# Patient Record
Sex: Female | Born: 1950 | Race: Black or African American | Hispanic: No | Marital: Married | Smoking: Current every day smoker
Health system: Southern US, Community
[De-identification: ages and names within clinical notes are randomized; demographics above are authoritative.]

## PROBLEM LIST (undated history)

## (undated) DIAGNOSIS — E785 Hyperlipidemia, unspecified: Secondary | ICD-10-CM

## (undated) DIAGNOSIS — C50919 Malignant neoplasm of unspecified site of unspecified female breast: Secondary | ICD-10-CM

## (undated) DIAGNOSIS — E041 Nontoxic single thyroid nodule: Secondary | ICD-10-CM

## (undated) DIAGNOSIS — Z800 Family history of malignant neoplasm of digestive organs: Secondary | ICD-10-CM

## (undated) DIAGNOSIS — F17200 Nicotine dependence, unspecified, uncomplicated: Secondary | ICD-10-CM

## (undated) DIAGNOSIS — K219 Gastro-esophageal reflux disease without esophagitis: Secondary | ICD-10-CM

## (undated) DIAGNOSIS — F419 Anxiety disorder, unspecified: Secondary | ICD-10-CM

## (undated) DIAGNOSIS — R232 Flushing: Secondary | ICD-10-CM

## (undated) DIAGNOSIS — F329 Major depressive disorder, single episode, unspecified: Secondary | ICD-10-CM

## (undated) DIAGNOSIS — C801 Malignant (primary) neoplasm, unspecified: Secondary | ICD-10-CM

## (undated) DIAGNOSIS — I10 Essential (primary) hypertension: Secondary | ICD-10-CM

## (undated) DIAGNOSIS — F32A Depression, unspecified: Secondary | ICD-10-CM

## (undated) DIAGNOSIS — Z1379 Encounter for other screening for genetic and chromosomal anomalies: Secondary | ICD-10-CM

## (undated) DIAGNOSIS — Z923 Personal history of irradiation: Secondary | ICD-10-CM

## (undated) HISTORY — DX: Nicotine dependence, unspecified, uncomplicated: F17.200

## (undated) HISTORY — DX: Anxiety disorder, unspecified: F41.9

## (undated) HISTORY — DX: Malignant (primary) neoplasm, unspecified: C80.1

## (undated) HISTORY — DX: Depression, unspecified: F32.A

## (undated) HISTORY — DX: Nontoxic single thyroid nodule: E04.1

## (undated) HISTORY — DX: Major depressive disorder, single episode, unspecified: F32.9

## (undated) HISTORY — DX: Hyperlipidemia, unspecified: E78.5

## (undated) HISTORY — DX: Personal history of irradiation: Z92.3

## (undated) HISTORY — DX: Essential (primary) hypertension: I10

## (undated) HISTORY — PX: TUBAL LIGATION: SHX77

## (undated) HISTORY — DX: Encounter for other screening for genetic and chromosomal anomalies: Z13.79

## (undated) HISTORY — PX: OTHER SURGICAL HISTORY: SHX169

## (undated) HISTORY — PX: BIOPSY THYROID: PRO38

## (undated) HISTORY — DX: Family history of malignant neoplasm of digestive organs: Z80.0

## (undated) HISTORY — DX: Flushing: R23.2

## (undated) HISTORY — DX: Gastro-esophageal reflux disease without esophagitis: K21.9

---

## 1978-11-16 HISTORY — PX: GYNECOLOGIC CRYOSURGERY: SHX857

## 1992-11-16 HISTORY — PX: BREAST SURGERY: SHX581

## 1998-05-18 ENCOUNTER — Emergency Department (HOSPITAL_COMMUNITY): Admission: EM | Admit: 1998-05-18 | Discharge: 1998-05-18 | Payer: Self-pay | Admitting: Emergency Medicine

## 1998-05-21 ENCOUNTER — Emergency Department (HOSPITAL_COMMUNITY): Admission: EM | Admit: 1998-05-21 | Discharge: 1998-05-21 | Payer: Self-pay

## 2000-03-02 ENCOUNTER — Emergency Department (HOSPITAL_COMMUNITY): Admission: EM | Admit: 2000-03-02 | Discharge: 2000-03-02 | Payer: Self-pay

## 2001-02-15 ENCOUNTER — Other Ambulatory Visit: Admission: RE | Admit: 2001-02-15 | Discharge: 2001-02-15 | Payer: Self-pay | Admitting: *Deleted

## 2001-02-15 ENCOUNTER — Encounter (INDEPENDENT_AMBULATORY_CARE_PROVIDER_SITE_OTHER): Payer: Self-pay | Admitting: Specialist

## 2003-03-12 ENCOUNTER — Other Ambulatory Visit: Admission: RE | Admit: 2003-03-12 | Discharge: 2003-03-12 | Payer: Self-pay | Admitting: Gynecology

## 2005-10-28 ENCOUNTER — Other Ambulatory Visit: Admission: RE | Admit: 2005-10-28 | Discharge: 2005-10-28 | Payer: Self-pay | Admitting: Gynecology

## 2005-12-04 ENCOUNTER — Encounter: Admission: RE | Admit: 2005-12-04 | Discharge: 2005-12-04 | Payer: Self-pay | Admitting: Family Medicine

## 2005-12-14 ENCOUNTER — Other Ambulatory Visit: Admission: RE | Admit: 2005-12-14 | Discharge: 2005-12-14 | Payer: Self-pay | Admitting: Interventional Radiology

## 2005-12-14 ENCOUNTER — Encounter: Admission: RE | Admit: 2005-12-14 | Discharge: 2005-12-14 | Payer: Self-pay | Admitting: Family Medicine

## 2005-12-14 ENCOUNTER — Encounter (INDEPENDENT_AMBULATORY_CARE_PROVIDER_SITE_OTHER): Payer: Self-pay | Admitting: *Deleted

## 2006-01-20 ENCOUNTER — Encounter (INDEPENDENT_AMBULATORY_CARE_PROVIDER_SITE_OTHER): Payer: Self-pay | Admitting: Specialist

## 2006-01-20 ENCOUNTER — Encounter: Admission: RE | Admit: 2006-01-20 | Discharge: 2006-01-20 | Payer: Self-pay | Admitting: Surgery

## 2006-04-28 ENCOUNTER — Encounter: Admission: RE | Admit: 2006-04-28 | Discharge: 2006-04-28 | Payer: Self-pay | Admitting: Surgery

## 2006-07-23 ENCOUNTER — Encounter: Admission: RE | Admit: 2006-07-23 | Discharge: 2006-07-23 | Payer: Self-pay | Admitting: Surgery

## 2006-12-23 ENCOUNTER — Other Ambulatory Visit: Admission: RE | Admit: 2006-12-23 | Discharge: 2006-12-23 | Payer: Self-pay | Admitting: Gynecology

## 2008-05-21 ENCOUNTER — Ambulatory Visit: Payer: Self-pay | Admitting: Gastroenterology

## 2008-05-28 ENCOUNTER — Encounter: Payer: Self-pay | Admitting: Gastroenterology

## 2008-05-28 ENCOUNTER — Ambulatory Visit: Payer: Self-pay | Admitting: Gastroenterology

## 2008-05-30 ENCOUNTER — Other Ambulatory Visit: Admission: RE | Admit: 2008-05-30 | Discharge: 2008-05-30 | Payer: Self-pay | Admitting: Gynecology

## 2008-05-30 ENCOUNTER — Encounter: Payer: Self-pay | Admitting: Gastroenterology

## 2010-01-15 ENCOUNTER — Encounter: Admission: RE | Admit: 2010-01-15 | Discharge: 2010-01-15 | Payer: Self-pay | Admitting: Surgery

## 2010-12-05 ENCOUNTER — Other Ambulatory Visit
Admission: RE | Admit: 2010-12-05 | Discharge: 2010-12-05 | Payer: Self-pay | Source: Home / Self Care | Admitting: Obstetrics and Gynecology

## 2010-12-05 ENCOUNTER — Other Ambulatory Visit: Payer: Self-pay | Admitting: Women's Health

## 2010-12-05 ENCOUNTER — Ambulatory Visit
Admission: RE | Admit: 2010-12-05 | Discharge: 2010-12-05 | Payer: Self-pay | Source: Home / Self Care | Attending: Women's Health | Admitting: Women's Health

## 2011-12-21 ENCOUNTER — Encounter (INDEPENDENT_AMBULATORY_CARE_PROVIDER_SITE_OTHER): Payer: Self-pay

## 2012-03-20 ENCOUNTER — Other Ambulatory Visit: Payer: Self-pay | Admitting: *Deleted

## 2012-03-20 ENCOUNTER — Ambulatory Visit (INDEPENDENT_AMBULATORY_CARE_PROVIDER_SITE_OTHER): Payer: Federal, State, Local not specified - PPO | Admitting: Family Medicine

## 2012-03-20 ENCOUNTER — Encounter: Payer: Self-pay | Admitting: Family Medicine

## 2012-03-20 DIAGNOSIS — T783XXA Angioneurotic edema, initial encounter: Secondary | ICD-10-CM

## 2012-03-20 DIAGNOSIS — L509 Urticaria, unspecified: Secondary | ICD-10-CM

## 2012-03-20 MED ORDER — EPINEPHRINE 0.3 MG/0.3ML IJ DEVI: 0.3000 mg | Freq: Once | INTRAMUSCULAR | Status: DC

## 2012-03-20 MED ORDER — HYDROXYZINE HCL 10 MG PO TABS: 10.0000 mg | ORAL_TABLET | Freq: Three times a day (TID) | ORAL | Status: DC | PRN

## 2012-03-20 MED ORDER — NON FORMULARY
10.0000 mg | Freq: Once | Status: AC
  Administered 2012-03-20: 10 mg via ORAL

## 2012-03-20 MED ORDER — NON FORMULARY
300.0000 mg | Freq: Once | Status: AC
  Administered 2012-03-20: 300 mg via ORAL

## 2012-03-20 MED ORDER — HYDROXYZINE HCL 10 MG PO TABS: 10.0000 mg | ORAL_TABLET | Freq: Three times a day (TID) | ORAL | Status: AC | PRN

## 2012-03-20 MED ORDER — PREDNISONE 20 MG PO TABS: ORAL_TABLET | ORAL | Status: DC

## 2012-03-20 MED ORDER — RANITIDINE HCL 150 MG PO TABS: 150.0000 mg | ORAL_TABLET | Freq: Two times a day (BID) | ORAL | Status: DC

## 2012-03-20 MED ORDER — METHYLPREDNISOLONE SODIUM SUCC 125 MG IJ SOLR
125.0000 mg | Freq: Once | INTRAMUSCULAR | Status: AC
  Administered 2012-03-20: 125 mg via INTRAVENOUS

## 2012-03-20 NOTE — Progress Notes (Signed)
  Subjective:    Patient ID: Jessica [name], female    DOB: [birth year], 61 y.o.   MRN: [id number]  HPI  Patient presents after developing urticaria yesterday afternoon after eating store bought potato salad.    Took benadryl 6 PM and again at 12:30 PM with improvement in urticaria.  Woke up today with significant swelling to lower lip and edema of her eye lids. Continues to be very uncomfortable with migratory urticaria.  No new soaps, lotions or other body products No new medications No recent travel Review of Systems     Objective:   Physical Exam  Constitutional: She appears well-developed.  HENT:  Mouth/Throat: No uvula swelling. No posterior oropharyngeal edema.       Edema upper lid OS>OD Swelling to lower lip  Neck: Neck supple. No thyromegaly present.  Cardiovascular: Normal rate, regular rhythm and normal heart sounds.   Pulmonary/Chest: Effort normal and breath sounds normal.  Neurological: She is alert.  Skin:       Urticaria face, neck, chest and extremities          Assessment & Plan:   1. Urticaria  methylPREDNISolone sodium succinate (SOLU-MEDROL) 125 mg/2 mL injection 125 mg, NON FORMULARY 300 mg, NON FORMULARY 10 mg,   2. Angioedema of lips  methylPREDNISolone sodium succinate (SOLU-MEDROL) 125 mg/2 mL injection 125 mg, NON FORMULARY 300 mg, NON FORMULARY 10 mg,     See medications on AVS ER overnight if angioedema returns

## 2012-03-21 ENCOUNTER — Telehealth: Payer: Self-pay

## 2012-03-21 NOTE — Telephone Encounter (Signed)
Pt called in and reported that her rash is completely gone and everything was cleared up after IV yesterday for allergic Rxn, but she got up in middle of night hungry and had some cereal and OJ and when she woke up she had some swelling in her upper lip again. No rash, no swelling of tongue/throat. Advised pt she should RTC to be evaluated. She stated she just took her prednisone, but she only took 10 mg bc she has taken 20 mg in past and she could not tolerate it - she can not take as Dr Hal Hope Rxd. Also, she hadn't taken hydroxyzine or Zantac yet. Pt stated she didn't get the Zantac bc it looked like it was another antihistimine. Advised pt that it is good to take it together w/hydrox for this type of Rxn. Pt stated she would take a dose of hydroxyzine and RTC if lip doesn't improve in a couple of hours. Pt did agree to RTC or ED if anything worsened and stated she also has her Epi Pen. Pt stated she will CB to let us know her status.

## 2012-03-21 NOTE — Telephone Encounter (Signed)
Noted  

## 2012-07-06 ENCOUNTER — Ambulatory Visit (INDEPENDENT_AMBULATORY_CARE_PROVIDER_SITE_OTHER): Payer: Self-pay | Admitting: Surgery

## 2012-09-01 ENCOUNTER — Emergency Department (HOSPITAL_COMMUNITY)
Admission: EM | Admit: 2012-09-01 | Discharge: 2012-09-01 | Disposition: A | Discharge status: Home / Self Care | Payer: Federal, State, Local not specified - PPO | Attending: Emergency Medicine | Admitting: Emergency Medicine

## 2012-09-01 ENCOUNTER — Encounter (HOSPITAL_COMMUNITY): Payer: Self-pay | Admitting: *Deleted

## 2012-09-01 DIAGNOSIS — I10 Essential (primary) hypertension: Secondary | ICD-10-CM | POA: Insufficient documentation

## 2012-09-01 DIAGNOSIS — Z853 Personal history of malignant neoplasm of breast: Secondary | ICD-10-CM | POA: Insufficient documentation

## 2012-09-01 DIAGNOSIS — F17200 Nicotine dependence, unspecified, uncomplicated: Secondary | ICD-10-CM | POA: Insufficient documentation

## 2012-09-01 DIAGNOSIS — E559 Vitamin D deficiency, unspecified: Secondary | ICD-10-CM | POA: Insufficient documentation

## 2012-09-01 DIAGNOSIS — M810 Age-related osteoporosis without current pathological fracture: Secondary | ICD-10-CM | POA: Insufficient documentation

## 2012-09-01 DIAGNOSIS — M542 Cervicalgia: Secondary | ICD-10-CM | POA: Insufficient documentation

## 2012-09-01 LAB — POCT I-STAT TROPONIN I: Troponin i, poc: 0 ng/mL (ref 0.00–0.08)

## 2012-09-01 MED ORDER — OXYCODONE-ACETAMINOPHEN 5-325 MG PO TABS
1.0000 | ORAL_TABLET | Freq: Once | ORAL | Status: AC
  Administered 2012-09-01: 1 via ORAL
  Filled 2012-09-01: qty 1

## 2012-09-01 MED ORDER — CYCLOBENZAPRINE HCL 10 MG PO TABS
10.0000 mg | ORAL_TABLET | Freq: Once | ORAL | Status: AC
  Administered 2012-09-01: 10 mg via ORAL
  Filled 2012-09-01: qty 1

## 2012-09-01 MED ORDER — IBUPROFEN 200 MG PO TABS
600.0000 mg | ORAL_TABLET | Freq: Once | ORAL | Status: AC
  Administered 2012-09-01: 600 mg via ORAL
  Filled 2012-09-01: qty 3

## 2012-09-01 MED ORDER — OXYCODONE-ACETAMINOPHEN 5-325 MG PO TABS: 1.0000 | ORAL_TABLET | ORAL | Status: DC | PRN

## 2012-09-01 MED ORDER — CYCLOBENZAPRINE HCL 10 MG PO TABS: 10.0000 mg | ORAL_TABLET | Freq: Two times a day (BID) | ORAL | Status: DC | PRN

## 2012-09-01 NOTE — ED Notes (Signed)
Pt reports L side neck, L arm pain that radiates to L upper chest since yesterday after lifting boxes at work.  Pt reports pain gets worse with movement.  Pt denies any SOB, nausea or diaphoresis at this time.

## 2012-09-01 NOTE — ED Provider Notes (Signed)
History     CSN: [id number]  Arrival date & time 09/01/12  4098   First MD Initiated Contact with Patient 09/01/12 [phone]      Chief Complaint  Patient presents with  . Neck Injury  . Extremity Pain    (Consider location/radiation/quality/duration/timing/severity/associated sxs/prior treatment) Patient is a 61 y.o. female presenting with neck injury and extremity pain. The history is provided by the patient. No language interpreter was used.  Neck Injury This is a recurrent problem. The current episode started in the past 7 days. The problem occurs daily. The problem has been gradually worsening. Associated symptoms include arthralgias and neck pain. Pertinent negatives include no headaches, nausea, numbness, rash, swollen glands, vertigo, vomiting or weakness. The symptoms are aggravated by twisting. She has tried acetaminophen for the symptoms. The treatment provided mild relief.  Extremity Pain Associated symptoms include arthralgias and neck pain. Pertinent negatives include no headaches, nausea, numbness, rash, swollen glands, vertigo, vomiting or weakness.   61 year old female coming in with 3 days of left shoulder left clavicle/scapula and left neck pain (para spinal). States that she's had this pain in the past. States that she was moving boxes yesterday and the pain got worse. States she took some Tylenol with partial relief. The pain is worse with raising of the left upper extremity in turning of the neck. Denies shortness of breath or nausea. The pain is reproducible. Next is criteria is met. Past Medical History  Diagnosis Date  . Cancer     BREAST CANCER 1990'S  . Thyroid nodule     BENIGN-DR CORNELL  . Hypertension     DR. KINGSLEY  . Anxiety   . Depression   . Vitamin D deficiency   . Smoker   . Osteoporosis 2009    -2.6 SPINE    Past Surgical History  Procedure Date  . Arm surgery     AT AGE 32-PLATE INSERTED-DUE TO FALL  . Gynecologic cryosurgery 1980   CYTOTHERAPY OF CERVIX    Family History  Problem Relation Age of Onset  . Cancer Mother     COLON  . Diabetes Sister   . Cancer Sister     COLON  . Heart disease Brother     History  Substance Use Topics  . Smoking status: Current Every Day Smoker -- 0.5 packs/day  . Smokeless tobacco: Not on file  . Alcohol Use: Yes    OB History    Grav Para Term Preterm Abortions TAB SAB Ect Mult Living                  Review of Systems  Constitutional: Negative.   HENT: Positive for neck pain.   Eyes: Negative.   Respiratory: Negative.   Cardiovascular: Negative.   Gastrointestinal: Negative.  Negative for nausea and vomiting.  Musculoskeletal: Positive for arthralgias. Negative for gait problem.       L shoulder/scapula/neck  Skin: Negative for rash.  Neurological: Negative.  Negative for dizziness, vertigo, facial asymmetry, weakness, light-headedness, numbness and headaches.  Psychiatric/Behavioral: Negative.   All other systems reviewed and are negative.    Allergies  Aspirin; Effexor; Ivp dye; and Penicillins  Home Medications   Current Outpatient Rx  Name Route Sig Dispense Refill  . ACETAMINOPHEN ER 650 MG PO TBCR Oral Take 650 mg by mouth every 8 (eight) hours as needed. pain    . AMLODIPINE BESYLATE 5 MG PO TABS Oral Take 5 mg by mouth daily.      Marland Kitchen  VITAMIN D 2000 UNITS PO CAPS Oral Take by mouth.      Marland Kitchen HYDROCHLOROTHIAZIDE 25 MG PO TABS Oral Take 25 mg by mouth at bedtime.    . MULTI-VITAMIN/MINERALS PO TABS Oral Take 1 tablet by mouth daily.    Marland Kitchen PAXIL PO Oral Take 10 mg by mouth See admin instructions. Pt takes only first 2 weeks of the month    . CYCLOBENZAPRINE HCL 10 MG PO TABS Oral Take 1 tablet (10 mg total) by mouth 2 (two) times daily as needed for muscle spasms. 20 tablet 0  . EPINEPHRINE 0.3 MG/0.3ML IJ DEVI Intramuscular Inject 0.3 mLs (0.3 mg total) into the muscle once. 2 Device 3  . OXYCODONE-ACETAMINOPHEN 5-325 MG PO TABS Oral Take 1 tablet by  mouth every 4 (four) hours as needed for pain. 10 tablet 0  . RANITIDINE HCL 150 MG PO TABS Oral Take 1 tablet (150 mg total) by mouth 2 (two) times daily. 60 tablet 0    BP 178/75  Pulse 58  Temp 98.8 F (37.1 C) (Oral)  Resp 20  SpO2 100%  Physical Exam  Nursing note and vitals reviewed. Constitutional: She is oriented to person, place, and time. She appears well-developed and well-nourished.  HENT:  Head: Normocephalic and atraumatic.  Eyes: Conjunctivae normal and EOM are normal. Pupils are equal, round, and reactive to light.  Neck: Normal range of motion. Neck supple.  Cardiovascular: Normal rate.   Pulmonary/Chest: Effort normal and breath sounds normal. No respiratory distress.  Abdominal: Soft. Bowel sounds are normal. She exhibits no distension.  Musculoskeletal: Normal range of motion. She exhibits tenderness. She exhibits no edema.       Limited ROM to LUE due to pain.  Nexus criteria met.  No cervical spine point tenderness. Pain is reproducible.  Neurological: She is alert and oriented to person, place, and time. She has normal reflexes.  Skin: Skin is warm and dry.  Psychiatric: She has a normal mood and affect.    ED Course  Procedures (including critical care time)   Labs Reviewed  POCT I-STAT TROPONIN I   No results found.   1. Neck pain on left side   2. Musculoskeletal neck pain       MDM  Recurrent L shoulder/neck scapula constant pain x 3 days that is reproducible.  Good relief in the ER after 1 percocet, 600mg  ibuprofen and flexeril and ice.   Doubt acs because pain is reproducible with no SOB. Trop and EKG unremarkable.  Will follow up with pcp or workman's comp.      Date: 09/01/2012  Rate: 58  Rhythm: sinus bradycardia  QRS Axis: normal  Intervals: normal  ST/T Wave abnormalities: normal  Conduction Disutrbances:none  Narrative Interpretation: ventricular hypertrophy  Old EKG Reviewed: unchanged         Remi Haggard,  NP 09/01/12 1105

## 2012-09-13 ENCOUNTER — Other Ambulatory Visit: Payer: Self-pay | Admitting: Family Medicine

## 2012-09-13 DIAGNOSIS — E042 Nontoxic multinodular goiter: Secondary | ICD-10-CM

## 2012-09-21 ENCOUNTER — Ambulatory Visit
Admission: RE | Admit: 2012-09-21 | Discharge: 2012-09-21 | Disposition: A | Discharge status: Home / Self Care | Payer: Federal, State, Local not specified - PPO | Source: Ambulatory Visit | Attending: Family Medicine | Admitting: Family Medicine

## 2012-09-21 DIAGNOSIS — E042 Nontoxic multinodular goiter: Secondary | ICD-10-CM

## 2012-09-23 ENCOUNTER — Telehealth (INDEPENDENT_AMBULATORY_CARE_PROVIDER_SITE_OTHER): Payer: Self-pay

## 2012-09-23 NOTE — Telephone Encounter (Signed)
Called patient to give her stable [place] report. If patient wants to watch area that's fine with [place] or if she wants to make appointment to discuss surgery to remove then she can call back and schedule sppt. She did not answer but left message to call back to give her this information.

## 2012-09-26 ENCOUNTER — Ambulatory Visit (INDEPENDENT_AMBULATORY_CARE_PROVIDER_SITE_OTHER): Payer: Federal, State, Local not specified - PPO | Admitting: Surgery

## 2012-10-04 ENCOUNTER — Encounter (INDEPENDENT_AMBULATORY_CARE_PROVIDER_SITE_OTHER): Payer: Self-pay | Admitting: Surgery

## 2012-10-04 ENCOUNTER — Ambulatory Visit (INDEPENDENT_AMBULATORY_CARE_PROVIDER_SITE_OTHER): Payer: Federal, State, Local not specified - PPO | Admitting: Surgery

## 2012-10-04 VITALS — BP 132/60 | HR 60 | Temp 97.3°F | Resp 16 | Ht 63.0 in | Wt 108.0 lb

## 2012-10-04 DIAGNOSIS — E049 Nontoxic goiter, unspecified: Secondary | ICD-10-CM

## 2012-10-04 MED ORDER — LEVOTHYROXINE SODIUM 100 MCG PO TABS: 100.0000 ug | ORAL_TABLET | Freq: Every day | ORAL | Status: DC

## 2012-10-04 NOTE — Progress Notes (Signed)
Patient ID: Jessica [name], female   DOB: [birth year], 61 y.o.   MRN: [id number]  Chief Complaint  Patient presents with  . Follow-up    ltf - goiter    HPI Jessica [name] is a 61 y.o. female.  Patient presents in followup for thyroid goiter. I saw in 2007 she underwent workup with ultrasound and fine-needle aspiration which have benign findings. We had a long talk back in about surgery versus observation and she chose observation. She has had 2 ultrasounds since that time and these have remained stable over 6 years. She is interested in trying Synthroid to see if this will treat her goiter. She continues to have no interest in surgical treatment of this problem. Otherwise, she feels well. Her voice is normal. She seems quite frequently and has no problems with that. Her weight is remained stable. She is not excessively anxious. She does have a small amount of pain around the anterior portion of her neck when she looks upward. HPI  Past Medical History  Diagnosis Date  . Cancer     BREAST CANCER 1990'S  . Thyroid nodule     BENIGN-DR CORNELL  . Hypertension     DR. KINGSLEY  . Anxiety   . Depression   . Vitamin D deficiency   . Smoker   . Osteoporosis 2009    -2.6 SPINE  . Hyperlipidemia   . GERD (gastroesophageal reflux disease)     Past Surgical History  Procedure Date  . Arm surgery     AT AGE 71-PLATE INSERTED-DUE TO FALL  . Gynecologic cryosurgery 1980    CYTOTHERAPY OF CERVIX    Family History  Problem Relation Age of Onset  . Cancer Mother     COLON  . Diabetes Sister   . Cancer Sister     COLON  . Heart disease Brother   . Cancer Father     throat    Social History History  Substance Use Topics  . Smoking status: Current Every Day Smoker -- 0.5 packs/day  . Smokeless tobacco: Not on file  . Alcohol Use: Yes    Allergies  Allergen Reactions  . Aspirin Nausea And Vomiting  . Effexor (Venlafaxine Hydrochloride) Other (See Comments)    Sees  things   . Ivp Dye (Iodinated Diagnostic Agents)     Happened 1970s per pt  . Penicillins     REACTION: hives/tongue swelled    Current Outpatient Prescriptions  Medication Sig Dispense Refill  . acetaminophen (ARTHRITIS PAIN RELIEF) 650 MG CR tablet Take 650 mg by mouth every 8 (eight) hours as needed. pain      . amLODipine (NORVASC) 5 MG tablet Take 5 mg by mouth daily.        . Cholecalciferol (VITAMIN D) 2000 UNITS CAPS Take by mouth.        . EPINEPHrine (EPI-PEN) 0.3 mg/0.3 mL DEVI Inject 0.3 mLs (0.3 mg total) into the muscle once.  2 Device  3  . hydrochlorothiazide (HYDRODIURIL) 25 MG tablet Take 25 mg by mouth at bedtime.      Marland Kitchen levothyroxine (SYNTHROID) 100 MCG tablet Take 1 tablet (100 mcg total) by mouth daily.  60 tablet  1  . Multiple Vitamins-Minerals (MULTIVITAMIN WITH MINERALS) tablet Take 1 tablet by mouth daily.      Marland Kitchen PARoxetine HCl (PAXIL PO) Take 10 mg by mouth See admin instructions. Pt takes only first 2 weeks of the month      . ranitidine (ZANTAC) 150 MG  tablet Take 1 tablet (150 mg total) by mouth 2 (two) times daily.  60 tablet  0    Review of Systems Review of Systems  Constitutional: Negative for fever, chills and unexpected weight change.  HENT: Negative for hearing loss, congestion, sore throat, trouble swallowing and voice change.   Eyes: Negative for visual disturbance.  Respiratory: Negative for cough and wheezing.   Cardiovascular: Negative for chest pain, palpitations and leg swelling.  Gastrointestinal: Negative for nausea, vomiting, abdominal pain, diarrhea, constipation, blood in stool, abdominal distention and anal bleeding.  Genitourinary: Negative for hematuria, vaginal bleeding and difficulty urinating.  Musculoskeletal: Negative for arthralgias.  Skin: Negative for rash and wound.  Neurological: Negative for seizures, syncope and headaches.  Hematological: Negative for adenopathy. Does not bruise/bleed easily.  Psychiatric/Behavioral:  Negative for confusion.    Blood pressure 132/60, pulse 60, temperature 97.3 F (36.3 C), temperature source Temporal, resp. rate 16, height 5\' 3"  (1.6 m), weight 108 lb (48.988 kg).  Physical Exam Physical Exam  Constitutional: She is oriented to person, place, and time. She appears well-developed and well-nourished.  HENT:  Head: Normocephalic and atraumatic.  Eyes: Pupils are equal, round, and reactive to light.  Neck: Thyromegaly present.  Pulmonary/Chest: Effort normal and breath sounds normal. No stridor.  Musculoskeletal: Normal range of motion.  Lymphadenopathy:    She has no cervical adenopathy.  Neurological: She is alert and oriented to person, place, and time.  Skin: Skin is warm and dry.  Psychiatric: She has a normal mood and affect. Her behavior is normal. Judgment and thought content normal.    Data Reviewed Clinical Data: Increasing size of multinodular goiter  THYROID ULTRASOUND  Technique: Ultrasound examination of the thyroid gland and adjacent  soft tissues was performed.  Comparison: Ultrasound of the thyroid of 01/15/2010  Findings:  Right thyroid lobe: 5.4 x 1.6 x 1.8 cm. (Previously 5.5 x 1.4 x  1.6 cm).  Left thyroid lobe: 6.9 x 2.2 x 3.7 cm. (Previously 6.6 x 2.9 x  3.3 cm).  Isthmus: 5 mm in thickness.  Focal nodules: The echogenicity of the thyroid gland is diffusely  inhomogeneous. Multiple thyroid nodules are present primarily  throughout the left lobe and isthmus. A nodule in the left isthmus  which is solid measures 3.2 x 1.7 x 2.1 cm compared to prior  measurements of 3.1 x 1.4 x 2.2 cm. A solid nodule in the mid  upper left lobe measures 3.2 x 1.7 x 2.5 cm compared to prior  measurements of 3.0 x 1.7 x 2.9 cm. A solid nodule in the mid left  lobe measures 2.7 x 2.0 x 2.1 cm, compared to prior measurements of  2.7 x 1.7 x 2.0 cm. A solid nodule in the lower pole of the left  lobe measures 3.0 x 1.7 x 2.5 cm compared to prior measurements of   3.0 x 1.8 x 2.0 cm. Nodules on the right measure no more than 7 mm  in diameter.  Lymphadenopathy: None visualized.  IMPRESSION:  Stable multinodular goiter with the dominant nodules occupying much  of the left lobe as well as the superior isthmus.  Original Report Authenticated By: Dwyane Dee, M.D.   Assessment    Multinodular goiter stable since 2007    Plan  pt has no interest in surgery and there is no change in the U/S over that time.  She had a FNA in 2007 that was benign.   She would like to try synthroid as a means  to shrink  The nodules.  Will start 100 mcg day and check TSH in one month.  Target TSH less than 0.07.         Jessica [name] A. 10/04/2012, 5:20 PM

## 2012-10-04 NOTE — Patient Instructions (Signed)
Goiter  Goiter is an enlarged thyroid gland. The thyroid gland sits at the base of the front of the neck. The gland produces hormones that regulate mood, body temperature, pulse rate, and digestion. Most goiters are painless and are not a cause for serious concern. Goiters and conditions that cause goiters can be treated if necessary.   CAUSES   Common causes of goiter include:   Graves disease (causes too much hormone to be produced [hyperthyroidism]).   Hashimoto's disease (causes too little hormone to be produced [hypothyroidism]).   Thyroiditis (inflammation of the thyroid sometimes caused by virus or pregnancy).   Nodular goiter (small bumps form; sometimes called toxic nodular goiter).   Pregnancy.   Thyroid cancer (very few goiters with nodules are cancerous).   Certain medications.   Radiation exposure.   Iodine deficiency (more common in developing countries in inland populations).  RISK FACTORS  Risk factors for goiter include:   A family history of goiter.   Female gender.   Inadequate iodine in the diet.   Age older than 40 years.  SYMPTOMS   Many goiters do not cause symptoms. When symptoms do occur, they may include:   Swelling in the lower part of the neck. This swelling can range from a very small bump to a large lump.   A tight feeling in the throat.   A hoarse voice.  Less commonly, a goiter may result in:   Coughing.   Wheezing.   Difficulty swallowing.   Difficulty breathing.   Bulging neck veins.   Dizziness.  When a goiter is the result of hyperthyroidism, symptoms may include:   Rapid or irregular heart beat.   Sicknessin your stomach (nausea).   Vomiting.   Diarrhea.   Shaking.   Irritable feeling.   Bulging eyes.   Weight loss.   Heat sensitivity.   Anxiety.  When a goiter is the result of hypothyroidism, symptoms may include:   Tiredness.   Dry skin.   Constipation.   Weight gain.   Irregular menstrual cycle.   Depressed mood.   Sensitivity to cold.   DIAGNOSIS   Tests used to diagnose goiter include:   A physical exam.   Blood tests, including thyroid hormone levels and antibody testing.   Ultrasonography, computerized X-ray scan (computed tomography, CT) or computerized magnetic scan (magnetic resonance imaging, MRI).   Thyroid scan (imaging along with safe radioactive injection).   Tissue sample taken (biopsy) of nodules. This is sometimes done to confirm that the nodules are not cancerous.  TREATMENT   Treatment will depend on the cause of the goiter. Treatment may include:   Monitoring. In some cases, no treatment is necessary, and your doctor will monitor yourcondition at regular check ups.   Medications and supplements. Thyroid medication (thyroid hormone replacement) is available for hyperthroidism and hypothyroidism.   If inflammation is the cause, over-the-counter medication or steroid medication may be recommended.   Goiters caused by iodine deficiency can be treated with iodine supplements or changes in diet.   Radioactive iodine treatment. Radioactive iodine is injected into the blood. It travels to the thyroid gland, kills thyroid cells, and reduces the size of the gland. This is only used when the thyroid gland is overactive. Lifelong thyroid hormone medication is often necessary after this treatment.   Surgery. A procedure to remove all or part of the gland may be recommended in severe cases or when cancer is the cause. Hormones can be taken to replace the hormones normally   produced by the thyroid.  HOME CARE INSTRUCTIONS    Take medications as directed.   Follow your caregiver's recommendations for any dietary changes.   Follow up with your caregiver for further examination and testing, as directed.  PREVENTION    If you have a family history of goiter, discuss screening with your doctor.   Make sure you are getting enough iodine in your diet.   Use of iodized table salt can help prevent iodine deficiency.   Document Released: 04/22/2010 Document Revised: 01/25/2012 Document Reviewed: 04/22/2010  [employer] Patient Information 2013 [employer]

## 2013-02-07 ENCOUNTER — Encounter (INDEPENDENT_AMBULATORY_CARE_PROVIDER_SITE_OTHER): Payer: Federal, State, Local not specified - PPO | Admitting: Surgery

## 2013-02-21 ENCOUNTER — Encounter (INDEPENDENT_AMBULATORY_CARE_PROVIDER_SITE_OTHER): Payer: Self-pay | Admitting: Surgery

## 2013-04-03 ENCOUNTER — Encounter (INDEPENDENT_AMBULATORY_CARE_PROVIDER_SITE_OTHER): Payer: Federal, State, Local not specified - PPO | Admitting: Surgery

## 2013-04-25 ENCOUNTER — Encounter: Payer: Self-pay | Admitting: Gastroenterology

## 2013-05-01 ENCOUNTER — Encounter (INDEPENDENT_AMBULATORY_CARE_PROVIDER_SITE_OTHER): Payer: Federal, State, Local not specified - PPO | Admitting: Surgery

## 2013-05-29 ENCOUNTER — Ambulatory Visit (INDEPENDENT_AMBULATORY_CARE_PROVIDER_SITE_OTHER): Payer: Federal, State, Local not specified - PPO | Admitting: Surgery

## 2013-05-29 ENCOUNTER — Encounter (INDEPENDENT_AMBULATORY_CARE_PROVIDER_SITE_OTHER): Payer: Self-pay | Admitting: Surgery

## 2013-05-29 VITALS — BP 137/78 | HR 60 | Resp 16 | Ht 63.0 in | Wt 102.2 lb

## 2013-05-29 DIAGNOSIS — E049 Nontoxic goiter, unspecified: Secondary | ICD-10-CM

## 2013-05-29 NOTE — Patient Instructions (Signed)
Will check blood work in 1 month.  Check U/S in November.  Return 1 year,

## 2013-05-29 NOTE — Progress Notes (Signed)
Patient ID: Jessica [name], female   DOB: [birth year], 62 y.o.   MRN: [id number]  Chief Complaint  Patient presents with  . Routine Post Op    reck goiter    HPI Jessica [name] is a 62 y.o. female.  Patient presents in followup for thyroid goiter. I saw in 2007 she underwent workup with ultrasound and fine-needle aspiration which have benign findings. We had a long talk back in about surgery versus observation and she chose observation. She has had 2 ultrasounds since that time and these have remained stable over 6 years. She is interested in trying Synthroid to see if this will treat her goiter. She continues to have no interest in surgical treatment of this problem. Otherwise, she feels well. Her voice is normal. She seems quite frequently and has no problems with that. Her weight is remained stable. She is not excessively anxious. She does have a small amount of pain around the anterior portion of her neck when she looks upward. HPI  Past Medical History  Diagnosis Date  . Cancer     BREAST CANCER 1990'S  . Thyroid nodule     BENIGN-DR CORNELL  . Hypertension     DR. KINGSLEY  . Anxiety   . Depression   . Vitamin D deficiency   . Smoker   . Osteoporosis 2009    -2.6 SPINE  . Hyperlipidemia   . GERD (gastroesophageal reflux disease)     Past Surgical History  Procedure Laterality Date  . Arm surgery      AT AGE 26-PLATE INSERTED-DUE TO FALL  . Gynecologic cryosurgery  1980    CYTOTHERAPY OF CERVIX    Family History  Problem Relation Age of Onset  . Cancer Mother     COLON  . Diabetes Sister   . Cancer Sister     COLON  . Heart disease Brother   . Cancer Father     throat    Social History History  Substance Use Topics  . Smoking status: Current Every Day Smoker -- 0.50 packs/day  . Smokeless tobacco: Not on file  . Alcohol Use: Yes    Allergies  Allergen Reactions  . Aspirin Nausea And Vomiting  . Effexor (Venlafaxine Hydrochloride) Other (See  Comments)    Sees things   . Ivp Dye (Iodinated Diagnostic Agents)     Happened 1970s per pt  . Penicillins     REACTION: hives/tongue swelled    Current Outpatient Prescriptions  Medication Sig Dispense Refill  . acetaminophen (ARTHRITIS PAIN RELIEF) 650 MG CR tablet Take 650 mg by mouth every 8 (eight) hours as needed. pain      . amLODipine (NORVASC) 5 MG tablet Take 5 mg by mouth daily.        . Cholecalciferol (VITAMIN D) 2000 UNITS CAPS Take by mouth.        . EPINEPHrine (EPI-PEN) 0.3 mg/0.3 mL DEVI Inject 0.3 mLs (0.3 mg total) into the muscle once.  2 Device  3  . hydrochlorothiazide (HYDRODIURIL) 25 MG tablet Take 25 mg by mouth at bedtime.      Marland Kitchen levothyroxine (SYNTHROID) 100 MCG tablet Take 1 tablet (100 mcg total) by mouth daily.  60 tablet  1  . Multiple Vitamins-Minerals (MULTIVITAMIN WITH MINERALS) tablet Take 1 tablet by mouth daily.      Marland Kitchen PARoxetine HCl (PAXIL PO) Take 10 mg by mouth See admin instructions. Pt takes only first 2 weeks of the month      .  Vitamin D, Ergocalciferol, (DRISDOL) 50000 UNITS CAPS        No current facility-administered medications for this visit.    Review of Systems Review of Systems  Constitutional: Negative for fever, chills and unexpected weight change.  HENT: Negative for hearing loss, congestion, sore throat, trouble swallowing and voice change.   Eyes: Negative for visual disturbance.  Respiratory: Negative for cough and wheezing.   Cardiovascular: Negative for chest pain, palpitations and leg swelling.  Gastrointestinal: Negative for nausea, vomiting, abdominal pain, diarrhea, constipation, blood in stool, abdominal distention and anal bleeding.  Genitourinary: Negative for hematuria, vaginal bleeding and difficulty urinating.  Musculoskeletal: Negative for arthralgias.  Skin: Negative for rash and wound.  Neurological: Negative for seizures, syncope and headaches.  Hematological: Negative for adenopathy. Does not bruise/bleed  easily.  Psychiatric/Behavioral: Negative for confusion.    Blood pressure 137/78, pulse 60, resp. rate 16, height 5\' 3"  (1.6 m), weight 102 lb 3.2 oz (46.358 kg).  Physical Exam Physical Exam  Constitutional: She is oriented to person, place, and time. She appears well-developed and well-nourished.  HENT:  Head: Normocephalic and atraumatic.  Eyes: Pupils are equal, round, and reactive to light.  Neck: Thyromegaly present.  Pulmonary/Chest: Effort normal and breath sounds normal. No stridor.  Musculoskeletal: Normal range of motion.  Lymphadenopathy:    She has no cervical adenopathy.  Neurological: She is alert and oriented to person, place, and time.  Skin: Skin is warm and dry.  Psychiatric: She has a normal mood and affect. Her behavior is normal. Judgment and thought content normal.    Data Reviewed CClinical Data: Increasing size of multinodular goiter  THYROID ULTRASOUND  Technique: Ultrasound examination of the thyroid gland and adjacent  soft tissues was performed.  Comparison: Ultrasound of the thyroid of 01/15/2010  Findings:  Right thyroid lobe: 5.4 x 1.6 x 1.8 cm. (Previously 5.5 x 1.4 x  1.6 cm).  Left thyroid lobe: 6.9 x 2.2 x 3.7 cm. (Previously 6.6 x 2.9 x  3.3 cm).  Isthmus: 5 mm in thickness.  Focal nodules: The echogenicity of the thyroid gland is diffusely  inhomogeneous. Multiple thyroid nodules are present primarily  throughout the left lobe and isthmus. A nodule in the left isthmus  which is solid measures 3.2 x 1.7 x 2.1 cm compared to prior  measurements of 3.1 x 1.4 x 2.2 cm. A solid nodule in the mid  upper left lobe measures 3.2 x 1.7 x 2.5 cm compared to prior  measurements of 3.0 x 1.7 x 2.9 cm. A solid nodule in the mid left  lobe measures 2.7 x 2.0 x 2.1 cm, compared to prior measurements of  2.7 x 1.7 x 2.0 cm. A solid nodule in the lower pole of the left  lobe measures 3.0 x 1.7 x 2.5 cm compared to prior measurements of  3.0 x 1.8 x 2.0  cm. Nodules on the right measure no more than 7 mm  in diameter.  Lymphadenopathy: None visualized.  IMPRESSION:  Stable multinodular goiter with the dominant nodules occupying much  of the left lobe as well as the superior isthmus.  Assessment    Multinodular goiter stable since 2007    Plan  pt has no interest in surgery and there is no change in the U/S over that time.  She had a FNA in 2007 that was benign.   She would like to try synthroid as a means to shrink  The nodules.  Will start 100 mcg day and  check TSH in one month.  Target TSH less than 0.07.  Follow up with u/s in November.  Return 1 year       Jessica [name] A. 05/29/2013, 3:44 PM

## 2013-05-30 ENCOUNTER — Telehealth (INDEPENDENT_AMBULATORY_CARE_PROVIDER_SITE_OTHER): Payer: Self-pay

## 2013-05-30 LAB — TSH: TSH: 0.26 u[IU]/mL — ABNORMAL LOW (ref 0.350–4.500)

## 2013-05-30 NOTE — Telephone Encounter (Signed)
Called patient and told her her TSH looks ok per dr Luisa Hart

## 2013-08-15 ENCOUNTER — Encounter (INDEPENDENT_AMBULATORY_CARE_PROVIDER_SITE_OTHER): Payer: Self-pay

## 2013-10-04 ENCOUNTER — Encounter: Payer: Federal, State, Local not specified - PPO | Admitting: Women's Health

## 2013-10-19 ENCOUNTER — Ambulatory Visit (INDEPENDENT_AMBULATORY_CARE_PROVIDER_SITE_OTHER): Payer: Federal, State, Local not specified - PPO | Admitting: Women's Health

## 2013-10-19 ENCOUNTER — Encounter: Payer: Self-pay | Admitting: Women's Health

## 2013-10-19 ENCOUNTER — Other Ambulatory Visit (HOSPITAL_COMMUNITY)
Admission: RE | Admit: 2013-10-19 | Discharge: 2013-10-19 | Disposition: A | Discharge status: Home / Self Care | Payer: Federal, State, Local not specified - PPO | Source: Ambulatory Visit | Attending: Gynecology | Admitting: Gynecology

## 2013-10-19 VITALS — BP 110/70 | Ht 63.0 in | Wt 105.8 lb

## 2013-10-19 DIAGNOSIS — C50219 Malignant neoplasm of upper-inner quadrant of unspecified female breast: Secondary | ICD-10-CM

## 2013-10-19 DIAGNOSIS — I10 Essential (primary) hypertension: Secondary | ICD-10-CM | POA: Insufficient documentation

## 2013-10-19 DIAGNOSIS — M810 Age-related osteoporosis without current pathological fracture: Secondary | ICD-10-CM

## 2013-10-19 DIAGNOSIS — C50212 Malignant neoplasm of upper-inner quadrant of left female breast: Secondary | ICD-10-CM | POA: Insufficient documentation

## 2013-10-19 DIAGNOSIS — C50211 Malignant neoplasm of upper-inner quadrant of right female breast: Secondary | ICD-10-CM

## 2013-10-19 DIAGNOSIS — Z01419 Encounter for gynecological examination (general) (routine) without abnormal findings: Secondary | ICD-10-CM

## 2013-10-19 NOTE — Patient Instructions (Signed)
Health Recommendations for Postmenopausal Women Respected and ongoing research has looked at the most common causes of death, disability, and poor quality of life in postmenopausal women. The causes include heart disease, diseases of blood vessels, diabetes, depression, cancer, and bone loss (osteoporosis). Many things can be done to help lower the chances of developing these and other common problems: CARDIOVASCULAR DISEASE Heart Disease: A heart attack is a medical emergency. Know the signs and symptoms of a heart attack. Below are things women can do to reduce their risk for heart disease.   Do not smoke. If you smoke, quit.  Aim for a healthy weight. Being overweight causes many preventable deaths. Eat a healthy and balanced diet and drink an adequate amount of liquids.  Get moving. Make a commitment to be more physically active. Aim for 30 minutes of activity on most, if not all days of the week.  Eat for heart health. Choose a diet that is low in saturated fat and cholesterol and eliminate trans fat. Include whole grains, vegetables, and fruits. Read and understand the labels on food containers before buying.  Know your numbers. Ask your caregiver to check your blood pressure, cholesterol (total, HDL, LDL, triglycerides) and blood glucose. Work with your caregiver on improving your entire clinical picture.  High blood pressure. Limit or stop your table salt intake (try salt substitute and food seasonings). Avoid salty foods and drinks. Read labels on food containers before buying. Eating well and exercising can help control high blood pressure. STROKE  Stroke is a medical emergency. Stroke may be the result of a blood clot in a blood vessel in the brain or by a brain hemorrhage (bleeding). Know the signs and symptoms of a stroke. To lower the risk of developing a stroke:  Avoid fatty foods.  Quit smoking.  Control your diabetes, blood pressure, and irregular heart rate. THROMBOPHLEBITIS  (BLOOD CLOT) OF THE LEG  Becoming overweight and leading a stationary lifestyle may also contribute to developing blood clots. Controlling your diet and exercising will help lower the risk of developing blood clots. CANCER SCREENING  Breast Cancer: Take steps to reduce your risk of breast cancer.  You should practice "breast self-awareness." This means understanding the normal appearance and feel of your breasts and should include breast self-examination. Any changes detected, no matter how small, should be reported to your caregiver.  After age 40, you should have a clinical breast exam (CBE) every year.  Starting at age 40, you should consider having a mammogram (breast X-ray) every year.  If you have a family history of breast cancer, talk to your caregiver about genetic screening.  If you are at high risk for breast cancer, talk to your caregiver about having an MRI and a mammogram every year.  Intestinal or Stomach Cancer: Tests to consider are a rectal exam, fecal occult blood, sigmoidoscopy, and colonoscopy. Women who are high risk may need to be screened at an earlier age and more often.  Cervical Cancer:  Beginning at age 30, you should have a Pap test every 3 years as long as the past 3 Pap tests have been normal.  If you have had past treatment for cervical cancer or a condition that could lead to cancer, you need Pap tests and screening for cancer for at least 20 years after your treatment.  If you had a hysterectomy for a problem that was not cancer or a condition that could lead to cancer, then you no longer need Pap tests.    If you are between ages 65 and 70, and you have had normal Pap tests going back 10 years, you no longer need Pap tests.  If Pap tests have been discontinued, risk factors (such as a new sexual partner) need to be reassessed to determine if screening should be resumed.  Some medical problems can increase the chance of getting cervical cancer. In these  cases, your caregiver may recommend more frequent screening and Pap tests.  Uterine Cancer: If you have vaginal bleeding after reaching menopause, you should notify your caregiver.  Ovarian cancer: Other than yearly pelvic exams, there are no reliable tests available to screen for ovarian cancer at this time except for yearly pelvic exams.  Lung Cancer: Yearly chest X-rays can detect lung cancer and should be done on high risk women, such as cigarette smokers and women with chronic lung disease (emphysema).  Skin Cancer: A complete body skin exam should be done at your yearly examination. Avoid overexposure to the sun and ultraviolet light lamps. Use a strong sun block cream when in the sun. All of these things are important in lowering the risk of skin cancer. MENOPAUSE Menopause Symptoms: Hormone therapy products are effective for treating symptoms associated with menopause:  Moderate to severe hot flashes.  Night sweats.  Mood swings.  Headaches.  Tiredness.  Loss of sex drive.  Insomnia.  Other symptoms. Hormone replacement carries certain risks, especially in older women. Women who use or are thinking about using estrogen or estrogen with progestin treatments should discuss that with their caregiver. Your caregiver will help you understand the benefits and risks. The ideal dose of hormone replacement therapy is not known. The Food and Drug Administration (FDA) has concluded that hormone therapy should be used only at the lowest doses and for the shortest amount of time to reach treatment goals.  OSTEOPOROSIS Protecting Against Bone Loss and Preventing Fracture: If you use hormone therapy for prevention of bone loss (osteoporosis), the risks for bone loss must outweigh the risk of the therapy. Ask your caregiver about other medications known to be safe and effective for preventing bone loss and fractures. To guard against bone loss or fractures, the following is recommended:  If  you are less than age 50, take 1000 mg of calcium and at least 600 mg of Vitamin D per day.  If you are greater than age 50 but less than age 70, take 1200 mg of calcium and at least 600 mg of Vitamin D per day.  If you are greater than age 70, take 1200 mg of calcium and at least 800 mg of Vitamin D per day. Smoking and excessive alcohol intake increases the risk of osteoporosis. Eat foods rich in calcium and vitamin D and do weight bearing exercises several times a week as your caregiver suggests. DIABETES Diabetes Melitus: If you have Type I or Type 2 diabetes, you should keep your blood sugar under control with diet, exercise and recommended medication. Avoid too many sweets, starchy and fatty foods. Being overweight can make control more difficult. COGNITION AND MEMORY Cognition and Memory: Menopausal hormone therapy is not recommended for the prevention of cognitive disorders such as Alzheimer's disease or memory loss.  DEPRESSION  Depression may occur at any age, but is common in elderly women. The reasons may be because of physical, medical, social (loneliness), or financial problems and needs. If you are experiencing depression because of medical problems and control of symptoms, talk to your caregiver about this. Physical activity and   exercise may help with mood and sleep. Community and volunteer involvement may help your sense of value and worth. If you have depression and you feel that the problem is getting worse or becoming severe, talk to your caregiver about treatment options that are best for you. ACCIDENTS  Accidents are common and can be serious in the elderly woman. Prepare your house to prevent accidents. Eliminate throw rugs, place hand bars in the bath, shower and toilet areas. Avoid wearing high heeled shoes or walking on wet, snowy, and icy areas. Limit or stop driving if you have vision or hearing problems, or you feel you are unsteady with you movements and  reflexes. HEPATITIS C Hepatitis C is a type of viral infection affecting the liver. It is spread mainly through contact with blood from an infected person. It can be treated, but if left untreated, it can lead to severe liver damage over years. Many people who are infected do not know that the virus is in their blood. If you are a "baby-boomer", it is recommended that you have one screening test for Hepatitis C. IMMUNIZATIONS  Several immunizations are important to consider having during your senior years, including:   Tetanus, diptheria, and pertussis booster shot.  Influenza every year before the flu season begins.  Pneumonia vaccine.  Shingles vaccine.  Others as indicated based on your specific needs. Talk to your caregiver about these. Document Released: 12/25/2005 Document Revised: 10/19/2012 Document Reviewed: 08/20/2008 [employer] Patient Information 2014 [employer].  

## 2013-10-19 NOTE — Progress Notes (Signed)
Jessica [name] 1951/09/10 [id number]    History:    The patient presents for annual exam.  Postmenopausal/no HRT/no bleeding. Breast cancer, lumpectomy only 1990. Cryo- 1980 with normal Paps after. 2009 Osteoporosis T score of -2.6 had been on Evista but stopped taking. Minimal healthcare for 2 years husband throat and tongue cancer, doing better. Hypertension/hypercholesterolemia/GERD/hypothyroid primary care manages. Smoker.  Past medical history, past surgical history, family history and social history were all reviewed and documented in the EPIC chart. 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 Mother and sister colon Cancer. Father throat cancer. Benign thyroid nodule.  ROS:  A  ROS was performed and pertinent positives and negatives are included in the history.  Exam:  Filed Vitals:   10/19/13 1613  BP: 110/70    General appearance:  Normal Head/Neck:  Normal, without cervical or supraclavicular adenopathy. Thyroid:  Symmetrical, normal in size, without palpable masses or nodularity. Respiratory  Effort:  Normal  Auscultation:  Clear without wheezing or rhonchi Cardiovascular  Auscultation:  Regular rate, without rubs, murmurs or gallops  Edema/varicosities:  Not grossly evident Abdominal  Soft,nontender, without masses, guarding or rebound.  Liver/spleen:  No organomegaly noted  Hernia:  None appreciated  Skin  Inspection:  Grossly normal  Palpation:  Grossly normal Neurologic/psychiatric  Orientation:  Normal with appropriate conversation.  Mood/affect:  Normal  Genitourinary    Breasts: Examined lying and sitting.     Right: Without masses, retractions, discharge or axillary adenopathy.     Left: 1 cm mobile nontender nodule outer quadrant at 9:00   Inguinal/mons:  Normal without inguinal adenopathy  External genitalia:  Normal  BUS/Urethra/Skene's glands:  Normal  Bladder:  Normal  Vagina:  Normal  Cervix:  Normal  Uterus:   normal in size, shape  and contour.  Midline and mobile  Adnexa/parametria:     Rt: Without masses or tenderness.   Lt: Without masses or tenderness.  Anus and perineum: Normal  Digital rectal exam: Normal sphincter tone without palpated masses or tenderness  Assessment/Plan:  62 y.o. MBF G4P4 for annual exam.     Left breast nodule Right breast cancer 1990 Osteoporosis 2009 Evista for several months. Smoker Hypertension/hypercholesterolemia/hypothyroid-primary care manages labs and meds  Plan: Diagnostic mammogram, will get scheduled for patient. Overdue for annual mammogram. Aware of importance of annual screen. SBE's, report changes. (States noticed several months ago but since  not painful did not call.) Regular exercise, calcium rich diet, vitamin D 2000 daily encouraged. Home safety and fall prevention discussed, schedule  DEXA. Aware of hazards of smoking will continue to decrease the amount. Labs at primary care. Pap. Pap Normal 2012, new screening guidelines reviewed. Will schedule followup screening colonoscopy.    Harrington Challenger [facility], 5:31 PM 10/19/2013

## 2013-10-20 ENCOUNTER — Telehealth: Payer: Self-pay | Admitting: *Deleted

## 2013-10-20 ENCOUNTER — Encounter: Payer: Self-pay | Admitting: Women's Health

## 2013-10-20 NOTE — Telephone Encounter (Signed)
Appt. 10/23/13 @ 3:30 pm pt informed, order faxed.

## 2013-10-20 NOTE — Telephone Encounter (Signed)
Message copied by Aura Camps on Fri Oct 20, 2013  9:00 AM ------      Message from: [place], [place] J      Created: Thu Oct 19, 2013  5:39 PM       Please schedule diagnostic mammogram left breast 1 cm mobile nodule outer quadrant at  9:00. Right breast cancer 1990. Late day best ------

## 2013-10-24 ENCOUNTER — Encounter: Payer: Self-pay | Admitting: Women's Health

## 2013-11-06 ENCOUNTER — Other Ambulatory Visit: Payer: Self-pay | Admitting: Radiology

## 2013-11-06 DIAGNOSIS — C50919 Malignant neoplasm of unspecified site of unspecified female breast: Secondary | ICD-10-CM

## 2013-11-06 HISTORY — DX: Malignant neoplasm of unspecified site of unspecified female breast: C50.919

## 2013-11-07 ENCOUNTER — Other Ambulatory Visit: Payer: Self-pay | Admitting: Radiology

## 2013-11-07 DIAGNOSIS — R922 Inconclusive mammogram: Secondary | ICD-10-CM

## 2013-11-08 ENCOUNTER — Telehealth: Payer: Self-pay | Admitting: *Deleted

## 2013-11-08 ENCOUNTER — Encounter: Payer: Self-pay | Admitting: Women's Health

## 2013-11-08 DIAGNOSIS — C50412 Malignant neoplasm of upper-outer quadrant of left female breast: Secondary | ICD-10-CM | POA: Insufficient documentation

## 2013-11-08 NOTE — Telephone Encounter (Signed)
Confirmed [facility] for 11/22/13 at 0800.  Instructions and contact information given. 

## 2013-11-13 ENCOUNTER — Ambulatory Visit
Admission: RE | Admit: 2013-11-13 | Discharge: 2013-11-13 | Disposition: A | Discharge status: Home / Self Care | Payer: Federal, State, Local not specified - PPO | Source: Ambulatory Visit | Attending: Radiology | Admitting: Radiology

## 2013-11-13 DIAGNOSIS — R922 Inconclusive mammogram: Secondary | ICD-10-CM

## 2013-11-13 MED ORDER — GADOBENATE DIMEGLUMINE 529 MG/ML IV SOLN
9.0000 mL | Freq: Once | INTRAVENOUS | Status: AC | PRN
  Administered 2013-11-13: 9 mL via INTRAVENOUS

## 2013-11-22 ENCOUNTER — Ambulatory Visit: Payer: Federal, State, Local not specified - PPO

## 2013-11-22 ENCOUNTER — Encounter (INDEPENDENT_AMBULATORY_CARE_PROVIDER_SITE_OTHER): Payer: Self-pay | Admitting: General Surgery

## 2013-11-22 ENCOUNTER — Ambulatory Visit: Payer: Federal, State, Local not specified - PPO | Attending: General Surgery | Admitting: Physical Therapy

## 2013-11-22 ENCOUNTER — Other Ambulatory Visit (HOSPITAL_BASED_OUTPATIENT_CLINIC_OR_DEPARTMENT_OTHER): Payer: Federal, State, Local not specified - PPO

## 2013-11-22 ENCOUNTER — Encounter: Payer: Self-pay | Admitting: *Deleted

## 2013-11-22 ENCOUNTER — Ambulatory Visit (HOSPITAL_BASED_OUTPATIENT_CLINIC_OR_DEPARTMENT_OTHER): Payer: Federal, State, Local not specified - PPO | Admitting: Oncology

## 2013-11-22 ENCOUNTER — Ambulatory Visit (HOSPITAL_BASED_OUTPATIENT_CLINIC_OR_DEPARTMENT_OTHER): Payer: Federal, State, Local not specified - PPO | Admitting: General Surgery

## 2013-11-22 ENCOUNTER — Encounter: Payer: Self-pay | Admitting: Oncology

## 2013-11-22 ENCOUNTER — Ambulatory Visit
Admission: RE | Admit: 2013-11-22 | Discharge: 2013-11-22 | Disposition: A | Discharge status: Home / Self Care | Payer: Federal, State, Local not specified - PPO | Source: Ambulatory Visit | Attending: Radiation Oncology | Admitting: Radiation Oncology

## 2013-11-22 ENCOUNTER — Telehealth: Payer: Self-pay | Admitting: Oncology

## 2013-11-22 VITALS — BP 171/70 | HR 51 | Temp 98.0°F | Resp 18 | Ht 63.0 in | Wt 103.8 lb

## 2013-11-22 DIAGNOSIS — Z170 Estrogen receptor positive status [ER+]: Secondary | ICD-10-CM

## 2013-11-22 DIAGNOSIS — C50412 Malignant neoplasm of upper-outer quadrant of left female breast: Secondary | ICD-10-CM

## 2013-11-22 DIAGNOSIS — M810 Age-related osteoporosis without current pathological fracture: Secondary | ICD-10-CM

## 2013-11-22 DIAGNOSIS — C50211 Malignant neoplasm of upper-inner quadrant of right female breast: Secondary | ICD-10-CM

## 2013-11-22 DIAGNOSIS — IMO0001 Reserved for inherently not codable concepts without codable children: Secondary | ICD-10-CM | POA: Insufficient documentation

## 2013-11-22 DIAGNOSIS — C773 Secondary and unspecified malignant neoplasm of axilla and upper limb lymph nodes: Secondary | ICD-10-CM | POA: Insufficient documentation

## 2013-11-22 DIAGNOSIS — F17200 Nicotine dependence, unspecified, uncomplicated: Secondary | ICD-10-CM

## 2013-11-22 DIAGNOSIS — I890 Lymphedema, not elsewhere classified: Secondary | ICD-10-CM | POA: Insufficient documentation

## 2013-11-22 DIAGNOSIS — Z853 Personal history of malignant neoplasm of breast: Secondary | ICD-10-CM | POA: Insufficient documentation

## 2013-11-22 DIAGNOSIS — C50419 Malignant neoplasm of upper-outer quadrant of unspecified female breast: Secondary | ICD-10-CM

## 2013-11-22 LAB — CBC WITH DIFFERENTIAL/PLATELET
BASO%: 1.7 % (ref 0.0–2.0)
BASOS ABS: 0.1 10*3/uL (ref 0.0–0.1)
EOS ABS: 0.3 10*3/uL (ref 0.0–0.5)
EOS%: 5.5 % (ref 0.0–7.0)
HCT: 40.7 % (ref 34.8–46.6)
HEMOGLOBIN: 13.5 g/dL (ref 11.6–15.9)
LYMPH%: 35.9 % (ref 14.0–49.7)
MCH: 29.5 pg (ref 25.1–34.0)
MCHC: 33.1 g/dL (ref 31.5–36.0)
MCV: 89.3 fL (ref 79.5–101.0)
MONO#: 0.5 10*3/uL (ref 0.1–0.9)
MONO%: 7.9 % (ref 0.0–14.0)
NEUT%: 49 % (ref 38.4–76.8)
NEUTROS ABS: 2.8 10*3/uL (ref 1.5–6.5)
PLATELETS: 251 10*3/uL (ref 145–400)
RBC: 4.56 10*6/uL (ref 3.70–5.45)
RDW: 13 % (ref 11.2–14.5)
WBC: 5.8 10*3/uL (ref 3.9–10.3)
lymph#: 2.1 10*3/uL (ref 0.9–3.3)

## 2013-11-22 LAB — COMPREHENSIVE METABOLIC PANEL (CC13)
ALBUMIN: 4.2 g/dL (ref 3.5–5.0)
ALK PHOS: 90 U/L (ref 40–150)
ALT: 12 U/L (ref 0–55)
AST: 18 U/L (ref 5–34)
Anion Gap: 10 mEq/L (ref 3–11)
BILIRUBIN TOTAL: 1.06 mg/dL (ref 0.20–1.20)
BUN: 10.3 mg/dL (ref 7.0–26.0)
CO2: 28 mEq/L (ref 22–29)
Calcium: 9.8 mg/dL (ref 8.4–10.4)
Chloride: 102 mEq/L (ref 98–109)
Creatinine: 0.8 mg/dL (ref 0.6–1.1)
GLUCOSE: 134 mg/dL (ref 70–140)
Potassium: 3.7 mEq/L (ref 3.5–5.1)
Sodium: 140 mEq/L (ref 136–145)
Total Protein: 7.5 g/dL (ref 6.4–8.3)

## 2013-11-22 NOTE — Progress Notes (Signed)
Patient ID: Jessica [name], female   DOB: 04/06/1951, 62 y.o.   MRN: [id number]  Chief Complaint  Patient presents with  . Other    HPI Jessica [name] is a 62 y.o. female.  Referred by Dr Beth Barnes HPI 62 yof who states she had a right breast cancer 20 years ago but this was apparently just treated with surgery.  She has felt a left breast mass for the last year that has not changed. She was seen by Nancy Young and referred to breast center.  On mm and u/s there is a 1.4 cm lobulated mass present.  MR shows a 1.3x1.2x.9 cm mass in the left breast.  The right breast is normal.  There are no abnormal nodes present.  She has no other complaints referable to either breast.  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 Cancer     BREAST CANCER 1990'S  . Thyroid nodule     BENIGN-DR CORNELL  . Hypertension     DR. KINGSLEY  . Anxiety   . Depression   . Vitamin D deficiency   . Smoker   . Osteoporosis 2009    -2.6 SPINE  . Hyperlipidemia   . GERD (gastroesophageal reflux disease)   . Hot flashes     Past Surgical History  Procedure Laterality Date  . Arm surgery      AT AGE 13-PLATE INSERTED-DUE TO FALL  . Gynecologic cryosurgery  1980    CYTOTHERAPY OF CERVIX    Family History  Problem Relation Age of Onset  . Cancer Mother     COLON  . Diabetes Sister   . Cancer Sister     COLON  . Heart disease Brother   . Cancer Father     throat    Social History History  Substance Use Topics  . Smoking status: Current Every Day Smoker -- 0.50 packs/day  . Smokeless tobacco: Not on file  . Alcohol Use: Yes    Allergies  Allergen Reactions  . Aspirin Nausea And Vomiting  . Effexor [Venlafaxine Hydrochloride] Other (See Comments)    Sees things   . Ivp Dye [Iodinated Diagnostic Agents]     Happened 1970s per pt  . Penicillins     REACTION: hives/tongue swelled     Current Outpatient Prescriptions  Medication Sig Dispense Refill  . acetaminophen (ARTHRITIS PAIN RELIEF) 650 MG CR tablet Take 650 mg by mouth every 8 (eight) hours as needed. pain      . amLODipine (NORVASC) 5 MG tablet Take 5 mg by mouth daily.        . Cholecalciferol (VITAMIN D) 2000 UNITS CAPS Take by mouth.        . EPINEPHrine (EPI-PEN) 0.3 mg/0.3 mL DEVI Inject 0.3 mLs (0.3 mg total) into the muscle once.  2 Device  3  . hydrochlorothiazide (HYDRODIURIL) 25 MG tablet Take 25 mg by mouth at bedtime.      . levothyroxine (SYNTHROID) 100 MCG tablet Take 1 tablet (100 mcg total) by mouth daily.  60 tablet  1  . Multiple Vitamins-Minerals (MULTIVITAMIN WITH MINERALS) tablet Take 1 tablet by mouth daily.      . PARoxetine HCl (PAXIL PO) Take 10 mg by mouth See admin instructions. Pt takes only first 2 weeks of the month      . Vitamin D, Ergocalciferol, (DRISDOL) 50000 UNITS CAPS          No current facility-administered medications for this visit.    Review of Systems Review of Systems  Constitutional: Negative for fever, chills and unexpected weight change.  HENT: Negative for congestion, hearing loss, sore throat, trouble swallowing and voice change.   Eyes: Negative for visual disturbance.  Respiratory: Negative for cough and wheezing.   Cardiovascular: Negative for chest pain, palpitations and leg swelling.  Gastrointestinal: Negative for nausea, vomiting, abdominal pain, diarrhea, constipation, blood in stool, abdominal distention and anal bleeding.  Genitourinary: Negative for hematuria, vaginal bleeding and difficulty urinating.  Musculoskeletal: Negative for arthralgias.  Skin: Negative for rash and wound.  Neurological: Negative for seizures, syncope and headaches.  Hematological: Negative for adenopathy. Does not bruise/bleed easily.  Psychiatric/Behavioral: Negative for confusion.    There were no vitals taken for this visit.  Physical Exam Physical Exam  Vitals  reviewed. Constitutional: She appears well-developed and well-nourished.  Neck: Neck supple.  Cardiovascular: Normal rate, regular rhythm and normal heart sounds.   Pulmonary/Chest: Effort normal and breath sounds normal. She has no wheezes. She has no rales. Right breast exhibits no inverted nipple, no mass, no nipple discharge, no skin change and no tenderness. Left breast exhibits mass. Left breast exhibits no inverted nipple, no nipple discharge, no skin change and no tenderness.    Lymphadenopathy:    She has no cervical adenopathy.    Data Reviewed EXAM:  BILATERAL BREAST MRI WITH AND WITHOUT CONTRAST  LABS: BUN and creatinine were obtained on site at [facility] at  [address]  Results: BUN 7 mg/dL, Creatinine 0.6 mg/dL.  TECHNIQUE:  Multiplanar, multisequence MR images of both breasts were obtained  prior to and following the intravenous administration of 9ml of  MultiHance  THREE-DIMENSIONAL MR IMAGE RENDERING ON INDEPENDENT WORKSTATION:  Three-dimensional MR images were rendered by post-processing of the  original MR data on an independent workstation. The  three-dimensional MR images were interpreted, and findings are  reported in the following complete MRI report for this study. Three  dimensional images were evaluated at the independent DynaCad  workstation  COMPARISON: Previous exams  FINDINGS:  Breast composition: c: Heterogeneous fibroglandular tissue  Background parenchymal enhancement: Moderate. There is mild motion  present.  Right breast: No mass or abnormal enhancement.  Left breast: There is an irregular enhancing mass with central clip  artifact located within the upper outer quadrant left breast  (axillary tail) measuring 1.3 x 1.2 x 0.9 cm in size. This is  associated with wash in/washout enhancement kinetics. There are no  additional left breast masses or abnormalities.  Lymph nodes: No abnormal appearing lymph nodes.  Ancillary  findings: None.  IMPRESSION:  1.3 cm enhancing mass located within the upper outer quadrant of the  left breast corresponding to the recently diagnosed left breast  invasive ductal carcinoma. No evidence for adenopathy and no  additional findings.    Assessment    Left breast cancer, clinical stage I     Plan    Left breast wire guided lumpectomy, left axillary sentinel node biopsy  We discussed the staging and pathophysiology of breast cancer. We discussed all of the different options for treatment for breast cancer including surgery, chemotherapy, radiation therapy, Herceptin, and antiestrogen therapy. Im not sure she actually had a breast cancer on the right side.  Will try to get her records from that.  We discussed a sentinel lymph node biopsy as she does not appear to having lymph node involvement right now. We discussed the   performance of that with injection of radioactive tracer and blue dye. We discussed that she would have an incision underneath her axillary hairline. We discussed that there is a chance of having a positive node with a sentinel lymph node biopsy and we will await the permanent pathology to make any other first further decisions in terms of her treatment. One of these options might be to return to the operating room to perform an axillary lymph node dissection. We discussed up to a 5% risk lifetime of chronic shoulder pain as well as lymphedema associated with a sentinel lymph node biopsy.  We discussed the options for treatment of the breast cancer which included lumpectomy versus a mastectomy. We discussed the performance of the lumpectomy with a wire placement. We discussed a 5-10% chance of a positive margin requiring reexcision in the operating room. We also discussed that she may need radiation therapy or antiestrogen therapy or both if she undergoes lumpectomy. We discussed the mastectomy and the postoperative care for that as well. We discussed that there is no  difference in her survival whether she undergoes lumpectomy with radiation therapy or antiestrogen therapy versus a mastectomy. We discussed the risks of operation including bleeding, infection, possible reoperation. She understands her further therapy will be based on what her stages at the time of her operation.          WAKEFIELD,MATTHEW 11/22/2013, 10:14 AM    

## 2013-11-22 NOTE — Progress Notes (Signed)
[place] [facility]  Telephone:(336) [phone] Fax:(336) [phone]     ID: [name] OB: [birth year]  MR#: [id number]  UXL#:[id number]  PCP: Gerrit Heck, MD GYN:  Freddie Apley SU: [name]; [name] OTHER MD: [name]; [name]  CHIEF COMPLAINT: "I have a mass that has been there for a while".  HISTORY OF PRESENT ILLNESS: "Jessica [name]" has noted a mass in her left breast for at least several months. She didn't think it was worth bringing to medical attention. When she went for routine gynecologic followup under [name] NP, this was palpated in the patient was set up for bilateral diagnostic mammography 10/23/2013 at [facility]. She was found to have breast density category C. A new irregular mass was noted in the left axillary tail. Ultrasound was obtained the same day and showed a 1.4 cm lobulated mass in the left breast at the 2:00 position. Biopsy of this mass 11/06/2013 showed ([id number]) an invasive ductal carcinoma, grade 2 or 3, estrogen receptor 80% positive, with moderate staining intensity; progesterone receptor 90% positive with strong staining intensity; with an MIB-1 of 10% and no HER-2 amplification (these signals ratio was 1.54 in the copy number per cell was 2.00).  On 11/13/2013 the patient underwent bilateral breast MRI. This showed a 1.3 cm irregular enhancing mass in the upper outer quadrant of the left breast (axillary tail). There were no other masses of concern in either breast and no abnormal appearing lymph nodes.  The patient's subsequent history is as detailed below.  INTERVAL HISTORY: Jessica [name] was evaluated in the multidisciplinary breast cancer clinic 11/22/[birth year] accompanied by her husband [name]  REVIEW OF SYSTEMS: Aside from the mass itself, and some anxiety problems, a detailed review of systems today was noncontributory. Specifically The patient denies unusual headaches, visual changes, nausea, vomiting, stiff neck,  dizziness, or gait imbalance. There has been no cough, phlegm production, or pleurisy, no chest pain or pressure, and no change in bowel or bladder habits. The patient denies fever, rash, bleeding, unexplained fatigue or unexplained weight loss.  PAST MEDICAL HISTORY: Past Medical History  Diagnosis Date  . Cancer     BREAST CANCER 1990'S  . Thyroid nodule     BENIGN-DR CORNELL  . Hypertension     DR. KINGSLEY  . Anxiety   . Depression   . Vitamin D deficiency   . Smoker   . Osteoporosis 2009    -2.6 SPINE  . Hyperlipidemia   . GERD (gastroesophageal reflux disease)   . Hot flashes     PAST SURGICAL HISTORY: Past Surgical History  Procedure Laterality Date  . Arm surgery      AT AGE 60-PLATE INSERTED-DUE TO FALL  . Gynecologic cryosurgery  1980    CYTOTHERAPY OF CERVIX    FAMILY HISTORY Family History  Problem Relation Age of Onset  . Cancer Mother     COLON  . Diabetes Sister   . Cancer Sister     COLON  . Heart disease Brother   . Cancer Father     throat   the patient's father died in his mid 6s with cancer of the throat. The patient's mother died at the age of 11 with colon cancer. The patient had 4 brothers and 5 sisters. One sister was diagnosed with stomach cancer but the patient does not know at what age. One sister was diagnosed with colon cancer at age 50. There is no history of breast or ovarian cancer in the family to the patient's  knowledge.  GYNECOLOGIC HISTORY:  Menarche age 63, first live birth age 63, the patient is [place] P4. She thinks she went through menopause approximately age 63. She did not take hormone replacement.  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 Her husband [name] (goes by [name]) is retired from the [employer]. He works part-time as a Horticulturist, commercial. Daughter [name] lives in [place] where she works as a Product/process development scientist. Daughter [name] lives in [place] where she  works as a Cabin crew. Daughter [name] works as a Marine scientist in [employer] here in [place]. Daughter [name] works as a Emergency planning/management officer in [place]. The patient has 7 grandchildren. She is a Psychologist, forensic.     ADVANCED DIRECTIVES: In place   HEALTH MAINTENANCE: History  Substance Use Topics  . Smoking status: Current Every Day Smoker -- 0.50 packs/day  . Smokeless tobacco: Not on file  . Alcohol Use: Yes     Colonoscopy:2005  PAP:2014  Bone density:Remote  Lipid panel:  Allergies  Allergen Reactions  . Aspirin Nausea And Vomiting  . Effexor [Venlafaxine Hydrochloride] Other (See Comments)    Sees things   . Ivp Dye [Iodinated Diagnostic Agents]     Happened 1970s per pt  . Penicillins     REACTION: hives/tongue swelled    Current Outpatient Prescriptions  Medication Sig Dispense Refill  . hydrochlorothiazide (HYDRODIURIL) 25 MG tablet Take 25 mg by mouth at bedtime.      Marland Kitchen acetaminophen (ARTHRITIS PAIN RELIEF) 650 MG CR tablet Take 650 mg by mouth every 8 (eight) hours as needed. pain      . amLODipine (NORVASC) 5 MG tablet Take 5 mg by mouth daily.        . Cholecalciferol (VITAMIN D) 2000 UNITS CAPS Take by mouth.        . EPINEPHrine (EPI-PEN) 0.3 mg/0.3 mL DEVI Inject 0.3 mLs (0.3 mg total) into the muscle once.  2 Device  3  . levothyroxine (SYNTHROID) 100 MCG tablet Take 1 tablet (100 mcg total) by mouth daily.  60 tablet  1  . Multiple Vitamins-Minerals (MULTIVITAMIN WITH MINERALS) tablet Take 1 tablet by mouth daily.      Marland Kitchen PARoxetine HCl (PAXIL PO) Take 10 mg by mouth See admin instructions. Pt takes only first 2 weeks of the month      . Vitamin D, Ergocalciferol, (DRISDOL) 50000 UNITS CAPS        No current facility-administered medications for this visit.    OBJECTIVE: 63-year-old [place] American woman in no acute distress  Filed Vitals:   11/22/13 0906  BP: 171/70  Pulse: 51  Temp: 98 F (36.7 C)  Resp: 18     Body mass index is 18.39 kg/(m^2).     ECOG FS:0 - Asymptomatic  Ocular: Sclerae unicteric, pupils equal, round and reactive to light Ear-nose-throat: Oropharynx clear,  no thrush or other lesions  Lymphatic: No cervical or supraclavicular adenopathy Lungs no rales or rhonchi, good excursion bilaterally Heart regular rate and rhythm, no murmur appreciated Abd soft, nontender, positive bowel sounds MSK no focal spinal tenderness, no joint edema Neuro: non-focal, well-oriented, appropriate affect Breasts: The right breast is status post biopsy in the upper inner quadrant. There are no suspicious masses, skin changes, or nipple changes of concern. The right axilla is benign. The left breast is status post recent lumpectomy. There is an easily palpable mass in the upper outer quadrant of the left breast. There are no skin  or nipple changes otherwise. The left axilla is benign.   LAB RESULTS:  CMP     Component Value Date/Time   NA 140 11/22/2013 0836   K 3.7 11/22/2013 0836   CO2 28 11/22/2013 0836   GLUCOSE 134 11/22/2013 0836   BUN 10.3 11/22/2013 0836   CREATININE 0.8 11/22/2013 0836   CALCIUM 9.8 11/22/2013 0836   PROT 7.5 11/22/2013 0836   ALBUMIN 4.2 11/22/2013 0836   AST 18 11/22/2013 0836   ALT 12 11/22/2013 0836   ALKPHOS 90 11/22/2013 0836   BILITOT 1.06 11/22/2013 0836    I No results found for this basename: SPEP, UPEP,  kappa and lambda light chains    Lab Results  Component Value Date   WBC 5.8 11/22/2013   NEUTROABS 2.8 11/22/2013   HGB 13.5 11/22/2013   HCT 40.7 11/22/2013   MCV 89.3 11/22/2013   PLT 251 11/22/2013      Chemistry      Component Value Date/Time   NA 140 11/22/2013 0836   K 3.7 11/22/2013 0836   CO2 28 11/22/2013 0836   BUN 10.3 11/22/2013 0836   CREATININE 0.8 11/22/2013 0836      Component Value Date/Time   CALCIUM 9.8 11/22/2013 0836   ALKPHOS 90 11/22/2013 0836   AST 18 11/22/2013 0836   ALT 12 11/22/2013 0836   BILITOT 1.06 11/22/2013 0836       No results found for this basename: LABCA2    No components found  with this basename: LABCA125    No results found for this basename: INR,  in the last 168 hours  Urinalysis No results found for this basename: colorurine, appearanceur, labspec, phurine, glucoseu, hgbur, bilirubinur, ketonesur, proteinur, urobilinogen, nitrite, leukocytesur    STUDIES: Mr Breast Bilateral W Wo Contrast  11/13/2013   CLINICAL DATA:  Recently diagnosed left breast invasive ductal carcinoma. Preoperative evaluation.  EXAM: BILATERAL BREAST MRI WITH AND WITHOUT CONTRAST  LABS:  BUN and creatinine were obtained on site at [facility] at  [address]  Results:  BUN 7 mg/dL,  Creatinine 0.6 mg/dL.  TECHNIQUE: Multiplanar, multisequence MR images of both breasts were obtained prior to and following the intravenous administration of 7m of MultiHance  THREE-DIMENSIONAL MR IMAGE RENDERING ON INDEPENDENT WORKSTATION:  Three-dimensional MR images were rendered by post-processing of the original MR data on an independent workstation. The three-dimensional MR images were interpreted, and findings are reported in the following complete MRI report for this study. Three dimensional images were evaluated at the independent DynaCad workstation  COMPARISON:  Previous exams  FINDINGS: Breast composition: c:  Heterogeneous fibroglandular tissue  Background parenchymal enhancement: Moderate. There is mild motion present.  Right breast: No mass or abnormal enhancement.  Left breast: There is an irregular enhancing mass with central clip artifact located within the upper outer quadrant left breast (axillary tail) measuring 1.3 x 1.2 x 0.9 cm in size. This is associated with wash in/washout enhancement kinetics. There are no additional left breast masses or abnormalities.  Lymph nodes: No abnormal appearing lymph nodes.  Ancillary findings:  None.  IMPRESSION: 1.3 cm enhancing mass located within the upper outer quadrant of the left breast corresponding to the recently diagnosed left breast  invasive ductal carcinoma. No evidence for adenopathy and no additional findings.  RECOMMENDATION: Treatment plan.  BI-RADS CATEGORY  6: Known biopsy-proven malignancy - appropriate action should be taken.   Electronically Signed   By: RLuberta RobertsonM.D.   On:  11/13/2013 16:39    ASSESSMENT: 63 y.o. [place] woman  (1) status post right upper inner quadrant lumpectomy 1994 for "breast cancer"; no data available; patient did not receive radiation, chemotherapy, or antiestrogen treatment  (2) status post left breast biopsy 11/06/2013 for a clinical T1c N0, stage IA invasive ductal carcinoma, grade 2, estrogen and progesterone receptor positive, HER-2 not amplified, with an MIB-1 of 10%  (3) genetics testing pending  PLAN: We spent the better part of today's hour-long appointment discussing the biology of breast cancer in general, and the specifics of the patient's tumor in particular. Jessica [name] understands she will clearly benefit from antiestrogen therapy as part of her systemic treatment. She would get no benefit from anti-HER-2 immunotherapy.  The difficult question of course is chemotherapy. The benefit for her may well be very marginal. For that reason NCCN guidelines recommend an Oncotype he set. This was discussed today and is being requested.  I have strongly advised Jessica [name] to discontinue smoking now and not resume smoking until 4 weeks postop at a minimum. She is very hesitant about quitting even temporarily but will strongly consider it.  Accordingly I plan to see Jessica [name] again in approximately 3 weeks after her definitive surgery to make a decision regarding chemotherapy. Jessica [name] has a good understanding of the overall plan, and agrees with it. She knows a goal of treatment in her case is cure. She will call with any problems that may develop before her next visit here.   Chauncey Cruel, MD   11/22/2013 10:23 AM

## 2013-11-22 NOTE — Progress Notes (Signed)
Checked in new patient with no financial issues. She has not been to [place] and she has her appt card and [facility] form.

## 2013-11-22 NOTE — Progress Notes (Signed)
Faxed [facility] form to Connie G at [facility]. 

## 2013-11-22 NOTE — Telephone Encounter (Signed)
, °

## 2013-11-22 NOTE — Progress Notes (Signed)
[facility] Radiation Oncology NEW PATIENT EVALUATION  Name: Jessica [name] MRN: [id number]  Date:   11/22/2013           DOB: 07/05/1951  Status: outpatient   CC: BARNES,ELIZABETH STEWART, MD  Wakefield, Matthew, MD    REFERRING PHYSICIAN: Wakefield, Matthew, MD   DIAGNOSIS: Stage I (T1, N0, M0) invasive ductal carcinoma of the left breast   HISTORY OF PRESENT ILLNESS:  Jessica [name] is a 62 y.o. female who is seen today at the [facility] for the courtesy of Dr. Wakefield for evaluation of her T1 N0 invasive ductal carcinoma of the left breast. She first noted a left breast mass approximately 3-4 months ago. She underwent mammography at [facility] on 10/23/2012 showing a new irregular mass. Ultrasound showed a 1.4 cm mass at 2:00 posteriorly. A biopsy on 11/06/2013 was diagnostic for invasive ductal carcinoma which was ER positive at 80%, PR +90% with a proliferation Ki-67 of 10%. Breast MR showed a 1.3 mass at 3:00, posteriorly. Of note is that she does give a history of what sounds like a benign breast biopsy by Dr. Ballen approximately 17 years ago. She did not receive adjuvant radiation therapy or chemotherapy/hormone therapy.  PREVIOUS RADIATION THERAPY: No   PAST MEDICAL HISTORY:  has a past medical history of Cancer; Thyroid nodule; Hypertension; Anxiety; Depression; Vitamin D deficiency; Smoker; Osteoporosis (2009); Hyperlipidemia; GERD (gastroesophageal reflux disease); and Hot flashes.     PAST SURGICAL HISTORY:  Past Surgical History  Procedure Laterality Date  . Arm surgery      AT AGE 13-PLATE INSERTED-DUE TO FALL  . Gynecologic cryosurgery  1980    CYTOTHERAPY OF CERVIX     FAMILY HISTORY: family history includes Cancer in her father, mother, and sister; Diabetes in her sister; Heart disease in her brother.   SOCIAL HISTORY:  reports that she has been smoking.  She does not have any smokeless tobacco history on file. She reports that she drinks  alcohol. She reports that she does not use illicit drugs.   ALLERGIES: Aspirin; Effexor; Ivp dye; and Penicillins   MEDICATIONS:  Current Outpatient Prescriptions  Medication Sig Dispense Refill  . acetaminophen (ARTHRITIS PAIN RELIEF) 650 MG CR tablet Take 650 mg by mouth every 8 (eight) hours as needed. pain      . amLODipine (NORVASC) 5 MG tablet Take 5 mg by mouth daily.        . Cholecalciferol (VITAMIN D) 2000 UNITS CAPS Take by mouth.        . EPINEPHrine (EPI-PEN) 0.3 mg/0.3 mL DEVI Inject 0.3 mLs (0.3 mg total) into the muscle once.  2 Device  3  . hydrochlorothiazide (HYDRODIURIL) 25 MG tablet Take 25 mg by mouth at bedtime.      . levothyroxine (SYNTHROID) 100 MCG tablet Take 1 tablet (100 mcg total) by mouth daily.  60 tablet  1  . Multiple Vitamins-Minerals (MULTIVITAMIN WITH MINERALS) tablet Take 1 tablet by mouth daily.      . PARoxetine HCl (PAXIL PO) Take 10 mg by mouth See admin instructions. Pt takes only first 2 weeks of the month      . Vitamin D, Ergocalciferol, (DRISDOL) 50000 UNITS CAPS        No current facility-administered medications for this encounter.     REVIEW OF SYSTEMS:  Pertinent items are noted in HPI.    PHYSICAL EXAM:  Alert and oriented 62-year-old after American female appearing younger than her stated age. Wt Readings from Last   3 Encounters:  11/22/13 103 lb 12.8 oz (47.083 kg)  10/19/13 105 lb 12.8 oz (47.991 kg)  05/29/13 102 lb 3.2 oz (46.358 kg)   Temp Readings from Last 3 Encounters:  11/22/13 98 F (36.7 C) Oral  10/04/12 97.3 F (36.3 C) Temporal  09/01/12 98.8 F (37.1 C) Oral   BP Readings from Last 3 Encounters:  11/22/13 171/70  10/19/13 110/70  05/29/13 137/78   Pulse Readings from Last 3 Encounters:  11/22/13 51  05/29/13 60  10/04/12 60   Head and neck examination: Grossly unremarkable. Nodes: Without palpable cervical, supraclavicular, or axillary lymphadenopathy. Chest: Lungs clear. Breasts: There is a palpable  mass measured 1.5 cm at 2:00 along the posterior upper outer quadrant of the left breast. There is a small scar 2:00 along the right breast from a previous biopsy. Abdomen: Without hepatomegaly. Extremities: Without edema.    LABORATORY DATA:  Lab Results  Component Value Date   WBC 5.8 11/22/2013   HGB 13.5 11/22/2013   HCT 40.7 11/22/2013   MCV 89.3 11/22/2013   PLT 251 11/22/2013   Lab Results  Component Value Date   NA 140 11/22/2013   K 3.7 11/22/2013   CO2 28 11/22/2013   Lab Results  Component Value Date   ALT 12 11/22/2013   AST 18 11/22/2013   ALKPHOS 90 11/22/2013   BILITOT 1.06 11/22/2013      IMPRESSION: Stage I (T1, N0, M0) invasive ductal carcinoma of the left breast. I saw the patient that her local regional management options include partial mastectomy followed by radiation therapy or mastectomy with or without reconstruction. She will need a sentinel lymph node biopsy. She desires breast preservation. We discussed the potential acute and late toxicities of radiation therapy. We also discussed hypofractionated treatment and also the possible need for her deep inspiration/breath-hold technology to avoid cardiac irradiation.   PLAN: As discussed above.  I spent 40 minutes minutes face to face with the patient and more than 50% of that time was spent in counseling and/or coordination of care.

## 2013-11-22 NOTE — Patient Instructions (Signed)
Smoking cessation instruction/counseling given:  counseled patient on the dangers of tobacco use, advised patient to stop smoking, and reviewed strategies to maximize success 

## 2013-11-23 ENCOUNTER — Other Ambulatory Visit: Payer: Self-pay | Admitting: Gynecology

## 2013-11-23 ENCOUNTER — Telehealth (INDEPENDENT_AMBULATORY_CARE_PROVIDER_SITE_OTHER): Payer: Self-pay | Admitting: General Surgery

## 2013-11-23 NOTE — Telephone Encounter (Signed)
Please schedule her.

## 2013-11-23 NOTE — Telephone Encounter (Signed)
Pt unable to pay her $381.55 deposit for surgery jan 26 th.  Please advise

## 2013-11-27 ENCOUNTER — Encounter (HOSPITAL_COMMUNITY): Admission: RE | Admit: 2013-11-27 | Payer: Federal, State, Local not specified - PPO | Source: Ambulatory Visit

## 2013-11-28 ENCOUNTER — Encounter: Payer: Self-pay | Admitting: *Deleted

## 2013-11-28 NOTE — Progress Notes (Signed)
[facility] Psychosocial Distress Screening  Clinical Social Work   Patient completed distress screening protocol, and scored a 0 on the Psychosocial Distress Thermometer which indicates no distress. Clinical Social Worker met with patient in [facility] on 11/22/13 to assess for distress and other psychosocial needs. Pt stated she was doing "ok", and felt comfortable after meeting with the physicians and getting more information on her treatment plan. CSW informed pt of the Support team and support services at [facility]. CSW encouraged pt to call with any additional questions or concerns.    Abigail Elmore, MSW, LCSW  Clinical Social Worker  [facility]  [phone]   

## 2013-11-30 ENCOUNTER — Telehealth: Payer: Self-pay | Admitting: *Deleted

## 2013-11-30 NOTE — Telephone Encounter (Signed)
Called and spoke with patient and confirmed new appointment for 12/28/13 8am with Dr. Jana Hakim.

## 2013-11-30 NOTE — Telephone Encounter (Signed)
Left message for return phone call.  Need to reschedule her appt. With Dr. Jana Hakim due to oncotype results will not be ready by 12/21/13.

## 2013-12-01 ENCOUNTER — Telehealth: Payer: Self-pay | Admitting: *Deleted

## 2013-12-01 NOTE — Telephone Encounter (Signed)
Pt left message in triage regarding scheduled dexa scan on 12/21/13. I left message for pt to call.

## 2013-12-04 ENCOUNTER — Other Ambulatory Visit: Payer: Self-pay | Admitting: Gynecology

## 2013-12-04 DIAGNOSIS — M810 Age-related osteoporosis without current pathological fracture: Secondary | ICD-10-CM

## 2013-12-05 ENCOUNTER — Encounter: Payer: Self-pay | Admitting: *Deleted

## 2013-12-05 ENCOUNTER — Encounter (INDEPENDENT_AMBULATORY_CARE_PROVIDER_SITE_OTHER): Payer: Federal, State, Local not specified - PPO | Admitting: General Surgery

## 2013-12-05 NOTE — Progress Notes (Signed)
Faxed Care Plan to Jessica at [facility] & to PCP.  Took to Med [facility] to scan. 

## 2013-12-07 ENCOUNTER — Encounter (HOSPITAL_BASED_OUTPATIENT_CLINIC_OR_DEPARTMENT_OTHER): Payer: Self-pay | Admitting: *Deleted

## 2013-12-07 NOTE — Progress Notes (Signed)
Cannot come in for ekg-had one 2013-will do dos

## 2013-12-11 ENCOUNTER — Encounter (HOSPITAL_BASED_OUTPATIENT_CLINIC_OR_DEPARTMENT_OTHER)
Admission: RE | Disposition: A | Discharge status: Home / Self Care | Payer: Self-pay | Source: Ambulatory Visit | Attending: General Surgery

## 2013-12-11 ENCOUNTER — Encounter (HOSPITAL_BASED_OUTPATIENT_CLINIC_OR_DEPARTMENT_OTHER): Payer: Self-pay | Admitting: *Deleted

## 2013-12-11 ENCOUNTER — Ambulatory Visit (HOSPITAL_BASED_OUTPATIENT_CLINIC_OR_DEPARTMENT_OTHER): Payer: Federal, State, Local not specified - PPO | Admitting: *Deleted

## 2013-12-11 ENCOUNTER — Encounter (HOSPITAL_BASED_OUTPATIENT_CLINIC_OR_DEPARTMENT_OTHER): Payer: Federal, State, Local not specified - PPO | Admitting: *Deleted

## 2013-12-11 ENCOUNTER — Encounter (HOSPITAL_COMMUNITY)
Admission: RE | Admit: 2013-12-11 | Discharge: 2013-12-11 | Disposition: A | Discharge status: Home / Self Care | Payer: Federal, State, Local not specified - PPO | Source: Ambulatory Visit | Attending: General Surgery | Admitting: General Surgery

## 2013-12-11 ENCOUNTER — Ambulatory Visit (HOSPITAL_BASED_OUTPATIENT_CLINIC_OR_DEPARTMENT_OTHER)
Admission: RE | Admit: 2013-12-11 | Discharge: 2013-12-11 | Disposition: A | Discharge status: Home / Self Care | Payer: Federal, State, Local not specified - PPO | Source: Ambulatory Visit | Attending: General Surgery | Admitting: General Surgery

## 2013-12-11 DIAGNOSIS — F411 Generalized anxiety disorder: Secondary | ICD-10-CM | POA: Insufficient documentation

## 2013-12-11 DIAGNOSIS — F3289 Other specified depressive episodes: Secondary | ICD-10-CM | POA: Insufficient documentation

## 2013-12-11 DIAGNOSIS — Z853 Personal history of malignant neoplasm of breast: Secondary | ICD-10-CM | POA: Insufficient documentation

## 2013-12-11 DIAGNOSIS — K219 Gastro-esophageal reflux disease without esophagitis: Secondary | ICD-10-CM | POA: Insufficient documentation

## 2013-12-11 DIAGNOSIS — C50412 Malignant neoplasm of upper-outer quadrant of left female breast: Secondary | ICD-10-CM

## 2013-12-11 DIAGNOSIS — F17200 Nicotine dependence, unspecified, uncomplicated: Secondary | ICD-10-CM | POA: Insufficient documentation

## 2013-12-11 DIAGNOSIS — E785 Hyperlipidemia, unspecified: Secondary | ICD-10-CM | POA: Insufficient documentation

## 2013-12-11 DIAGNOSIS — C50919 Malignant neoplasm of unspecified site of unspecified female breast: Secondary | ICD-10-CM | POA: Insufficient documentation

## 2013-12-11 DIAGNOSIS — E041 Nontoxic single thyroid nodule: Secondary | ICD-10-CM | POA: Insufficient documentation

## 2013-12-11 DIAGNOSIS — D0590 Unspecified type of carcinoma in situ of unspecified breast: Secondary | ICD-10-CM

## 2013-12-11 DIAGNOSIS — I10 Essential (primary) hypertension: Secondary | ICD-10-CM | POA: Insufficient documentation

## 2013-12-11 DIAGNOSIS — F329 Major depressive disorder, single episode, unspecified: Secondary | ICD-10-CM | POA: Insufficient documentation

## 2013-12-11 DIAGNOSIS — M810 Age-related osteoporosis without current pathological fracture: Secondary | ICD-10-CM | POA: Insufficient documentation

## 2013-12-11 DIAGNOSIS — C50419 Malignant neoplasm of upper-outer quadrant of unspecified female breast: Secondary | ICD-10-CM | POA: Insufficient documentation

## 2013-12-11 HISTORY — PX: BREAST LUMPECTOMY WITH NEEDLE LOCALIZATION AND AXILLARY SENTINEL LYMPH NODE BX: SHX5760

## 2013-12-11 LAB — POCT HEMOGLOBIN-HEMACUE: Hemoglobin: 13.9 g/dL (ref 12.0–15.0)

## 2013-12-11 SURGERY — BREAST LUMPECTOMY WITH NEEDLE LOCALIZATION AND AXILLARY SENTINEL LYMPH NODE BX
Anesthesia: General | Site: Breast | Laterality: Left

## 2013-12-11 MED ORDER — ONDANSETRON HCL 4 MG/2ML IJ SOLN
INTRAMUSCULAR | Status: DC | PRN
  Administered 2013-12-11: 4 mg via INTRAVENOUS

## 2013-12-11 MED ORDER — CIPROFLOXACIN IN D5W 400 MG/200ML IV SOLN
400.0000 mg | INTRAVENOUS | Status: AC
  Administered 2013-12-11: 400 mg via INTRAVENOUS

## 2013-12-11 MED ORDER — FENTANYL CITRATE 0.05 MG/ML IJ SOLN
INTRAMUSCULAR | Status: AC
  Filled 2013-12-11: qty 2

## 2013-12-11 MED ORDER — FENTANYL CITRATE 0.05 MG/ML IJ SOLN
50.0000 ug | INTRAMUSCULAR | Status: DC | PRN
  Administered 2013-12-11: 50 ug via INTRAVENOUS

## 2013-12-11 MED ORDER — LIDOCAINE HCL (CARDIAC) 20 MG/ML IV SOLN
INTRAVENOUS | Status: DC | PRN
  Administered 2013-12-11: 60 mg via INTRAVENOUS

## 2013-12-11 MED ORDER — HYDROMORPHONE HCL PF 1 MG/ML IJ SOLN
0.2500 mg | INTRAMUSCULAR | Status: DC | PRN
  Administered 2013-12-11 (×2): 0.5 mg via INTRAVENOUS
  Filled 2013-12-11: qty 1

## 2013-12-11 MED ORDER — MIDAZOLAM HCL 2 MG/2ML IJ SOLN
INTRAMUSCULAR | Status: AC
  Filled 2013-12-11: qty 2

## 2013-12-11 MED ORDER — CIPROFLOXACIN IN D5W 400 MG/200ML IV SOLN
INTRAVENOUS | Status: AC
  Filled 2013-12-11: qty 200

## 2013-12-11 MED ORDER — OXYCODONE HCL 5 MG/5ML PO SOLN: 5.0000 mg | Freq: Once | ORAL | Status: DC | PRN

## 2013-12-11 MED ORDER — SODIUM CHLORIDE 0.9 % IJ SOLN
INTRAMUSCULAR | Status: AC
  Filled 2013-12-11: qty 10

## 2013-12-11 MED ORDER — TECHNETIUM TC 99M SULFUR COLLOID FILTERED
1.0000 | Freq: Once | INTRAVENOUS | Status: AC | PRN
  Administered 2013-12-11: 1 via INTRADERMAL

## 2013-12-11 MED ORDER — MIDAZOLAM HCL 2 MG/2ML IJ SOLN
1.0000 mg | INTRAMUSCULAR | Status: DC | PRN
Start: 2013-12-11 — End: 2013-12-11
  Administered 2013-12-11: 1 mg via INTRAVENOUS

## 2013-12-11 MED ORDER — OXYCODONE-ACETAMINOPHEN 10-325 MG PO TABS: 1.0000 | ORAL_TABLET | Freq: Four times a day (QID) | ORAL | Status: DC | PRN

## 2013-12-11 MED ORDER — BUPIVACAINE HCL (PF) 0.25 % IJ SOLN
INTRAMUSCULAR | Status: DC | PRN
  Administered 2013-12-11: 30 mL

## 2013-12-11 MED ORDER — ONDANSETRON HCL 4 MG/2ML IJ SOLN: 4.0000 mg | Freq: Four times a day (QID) | INTRAMUSCULAR | Status: DC | PRN

## 2013-12-11 MED ORDER — PROPOFOL 10 MG/ML IV BOLUS
INTRAVENOUS | Status: AC
  Filled 2013-12-11: qty 20

## 2013-12-11 MED ORDER — METHYLENE BLUE 1 % INJ SOLN
INTRAMUSCULAR | Status: AC
  Filled 2013-12-11: qty 10

## 2013-12-11 MED ORDER — DEXAMETHASONE SODIUM PHOSPHATE 4 MG/ML IJ SOLN
INTRAMUSCULAR | Status: DC | PRN
  Administered 2013-12-11: 8 mg via INTRAVENOUS

## 2013-12-11 MED ORDER — FENTANYL CITRATE 0.05 MG/ML IJ SOLN
INTRAMUSCULAR | Status: AC
  Filled 2013-12-11: qty 6

## 2013-12-11 MED ORDER — FENTANYL CITRATE 0.05 MG/ML IJ SOLN
INTRAMUSCULAR | Status: DC | PRN
  Administered 2013-12-11: 25 ug via INTRAVENOUS

## 2013-12-11 MED ORDER — PROPOFOL 10 MG/ML IV BOLUS
INTRAVENOUS | Status: DC | PRN
  Administered 2013-12-11: 150 mg via INTRAVENOUS

## 2013-12-11 MED ORDER — LACTATED RINGERS IV SOLN
INTRAVENOUS | Status: DC
  Administered 2013-12-11: 10 mL/h via INTRAVENOUS
  Administered 2013-12-11: 13:00:00 via INTRAVENOUS

## 2013-12-11 MED ORDER — OXYCODONE HCL 5 MG PO TABS: 5.0000 mg | ORAL_TABLET | Freq: Once | ORAL | Status: DC | PRN

## 2013-12-11 SURGICAL SUPPLY — 65 items
ADH SKN CLS APL DERMABOND .7 (GAUZE/BANDAGES/DRESSINGS) ×1
APL SKNCLS STERI-STRIP NONHPOA (GAUZE/BANDAGES/DRESSINGS)
APPLIER CLIP 9.375 MED OPEN (MISCELLANEOUS) ×2
APR CLP MED 9.3 20 MLT OPN (MISCELLANEOUS) ×1
BENZOIN TINCTURE PRP APPL 2/3 (GAUZE/BANDAGES/DRESSINGS) IMPLANT
BINDER BREAST LRG (GAUZE/BANDAGES/DRESSINGS) IMPLANT
BINDER BREAST MEDIUM (GAUZE/BANDAGES/DRESSINGS) ×1 IMPLANT
BINDER BREAST XLRG (GAUZE/BANDAGES/DRESSINGS) IMPLANT
BINDER BREAST XXLRG (GAUZE/BANDAGES/DRESSINGS) IMPLANT
BLADE SURG 15 STRL LF DISP TIS (BLADE) ×1 IMPLANT
BLADE SURG 15 STRL SS (BLADE) ×2
BNDG COHESIVE 4X5 TAN STRL (GAUZE/BANDAGES/DRESSINGS) IMPLANT
CANISTER SUCT 1200ML W/VALVE (MISCELLANEOUS) ×2 IMPLANT
CHLORAPREP W/TINT 26ML (MISCELLANEOUS) ×2 IMPLANT
CLIP APPLIE 9.375 MED OPEN (MISCELLANEOUS) ×1 IMPLANT
COVER MAYO STAND STRL (DRAPES) ×2 IMPLANT
COVER PROBE W GEL 5X96 (DRAPES) ×2 IMPLANT
COVER TABLE BACK 60X90 (DRAPES) ×2 IMPLANT
DECANTER SPIKE VIAL GLASS SM (MISCELLANEOUS) IMPLANT
DERMABOND ADVANCED (GAUZE/BANDAGES/DRESSINGS) ×1
DERMABOND ADVANCED .7 DNX12 (GAUZE/BANDAGES/DRESSINGS) IMPLANT
DEVICE DUBIN W/COMP PLATE 8390 (MISCELLANEOUS) ×1 IMPLANT
DRAIN CHANNEL 19F RND (DRAIN) IMPLANT
DRAPE LAPAROSCOPIC ABDOMINAL (DRAPES) ×1 IMPLANT
DRAPE U-SHAPE 76X120 STRL (DRAPES) IMPLANT
DRSG TEGADERM 4X4.75 (GAUZE/BANDAGES/DRESSINGS) ×3 IMPLANT
ELECT COATED BLADE 2.86 ST (ELECTRODE) ×2 IMPLANT
ELECT REM PT RETURN 9FT ADLT (ELECTROSURGICAL) ×2
ELECTRODE REM PT RTRN 9FT ADLT (ELECTROSURGICAL) ×1 IMPLANT
EVACUATOR SILICONE 100CC (DRAIN) IMPLANT
GLOVE BIO SURGEON STRL SZ7 (GLOVE) ×2 IMPLANT
GLOVE BIOGEL PI IND STRL 7.5 (GLOVE) ×1 IMPLANT
GLOVE BIOGEL PI INDICATOR 7.5 (GLOVE) ×1
GOWN STRL REUS W/ TWL LRG LVL3 (GOWN DISPOSABLE) ×3 IMPLANT
GOWN STRL REUS W/TWL LRG LVL3 (GOWN DISPOSABLE) ×6
KIT MARKER MARGIN INK (KITS) ×2 IMPLANT
NDL HYPO 25X1 1.5 SAFETY (NEEDLE) ×1 IMPLANT
NDL SAFETY ECLIPSE 18X1.5 (NEEDLE) IMPLANT
NEEDLE HYPO 18GX1.5 SHARP (NEEDLE)
NEEDLE HYPO 25X1 1.5 SAFETY (NEEDLE) ×2 IMPLANT
NS IRRIG 1000ML POUR BTL (IV SOLUTION) IMPLANT
PACK BASIN DAY SURGERY FS (CUSTOM PROCEDURE TRAY) ×2 IMPLANT
PENCIL BUTTON HOLSTER BLD 10FT (ELECTRODE) ×2 IMPLANT
PIN SAFETY STERILE (MISCELLANEOUS) IMPLANT
SLEEVE SCD COMPRESS KNEE MED (MISCELLANEOUS) ×2 IMPLANT
SPONGE GAUZE 4X4 12PLY STER LF (GAUZE/BANDAGES/DRESSINGS) IMPLANT
SPONGE LAP 18X18 X RAY DECT (DISPOSABLE) IMPLANT
SPONGE LAP 4X18 X RAY DECT (DISPOSABLE) ×2 IMPLANT
STAPLER VISISTAT 35W (STAPLE) ×2 IMPLANT
STOCKINETTE IMPERVIOUS LG (DRAPES) IMPLANT
STRIP CLOSURE SKIN 1/2X4 (GAUZE/BANDAGES/DRESSINGS) ×1 IMPLANT
SUT MNCRL AB 4-0 PS2 18 (SUTURE) ×3 IMPLANT
SUT MON AB 5-0 PS2 18 (SUTURE) IMPLANT
SUT SILK 2 0 SH (SUTURE) IMPLANT
SUT VIC AB 2-0 SH 27 (SUTURE) ×4
SUT VIC AB 2-0 SH 27XBRD (SUTURE) ×1 IMPLANT
SUT VIC AB 3-0 SH 27 (SUTURE) ×2
SUT VIC AB 3-0 SH 27X BRD (SUTURE) ×1 IMPLANT
SUT VIC AB 5-0 PS2 18 (SUTURE) IMPLANT
SUT VICRYL AB 3 0 TIES (SUTURE) IMPLANT
SYR CONTROL 10ML LL (SYRINGE) ×2 IMPLANT
TOWEL OR 17X24 6PK STRL BLUE (TOWEL DISPOSABLE) ×2 IMPLANT
TOWEL OR NON WOVEN STRL DISP B (DISPOSABLE) ×2 IMPLANT
TUBE CONNECTING 20X1/4 (TUBING) ×2 IMPLANT
YANKAUER SUCT BULB TIP NO VENT (SUCTIONS) ×2 IMPLANT

## 2013-12-11 NOTE — Interval H&P Note (Signed)
History and Physical Interval Note:  12/11/2013 1:24 PM  Jessica [name]  has presented today for surgery, with the diagnosis of LEFT BREAST CANCER   The various methods of treatment have been discussed with the patient and family. After consideration of risks, benefits and other options for treatment, the patient has consented to  Procedure(s): BREAST LUMPECTOMY WITH NEEDLE LOCALIZATION AND AXILLARY SENTINEL LYMPH NODE BX (Left) as a surgical intervention .  The patient's history has been reviewed, patient examined, no change in status, stable for surgery.  I have reviewed the patient's chart and labs.  Questions were answered to the patient's satisfaction.     [facility]

## 2013-12-11 NOTE — Anesthesia Postprocedure Evaluation (Signed)
Anesthesia Post Note  Patient: Jessica [name]  Procedure(s) Performed: Procedure(s) (LRB): BREAST LUMPECTOMY WITH NEEDLE LOCALIZATION AND AXILLARY SENTINEL LYMPH NODE BX (Left)  Anesthesia type: General  Patient location: PACU  Post pain: Pain level controlled and Adequate analgesia  Post assessment: Post-op Vital signs reviewed, Patient's Cardiovascular Status Stable, Respiratory Function Stable, Patent Airway and Pain level controlled  Last Vitals:  Filed Vitals:   12/11/13 1515  BP:   Pulse: 44  Temp:   Resp: 11    Post vital signs: Reviewed and stable  Level of consciousness: awake, alert  and oriented  Complications: No apparent anesthesia complications

## 2013-12-11 NOTE — Progress Notes (Signed)
Nuc med injection performed by radiology staff. Pt tol well. VSS documented in doc flowsheets.

## 2013-12-11 NOTE — Transfer of Care (Signed)
Immediate Anesthesia Transfer of Care Note  Patient: Jessica [name]  Procedure(s) Performed: Procedure(s): BREAST LUMPECTOMY WITH NEEDLE LOCALIZATION AND AXILLARY SENTINEL LYMPH NODE BX (Left)  Patient Location: PACU  Anesthesia Type:General  Level of Consciousness: awake, alert  and oriented  Airway & Oxygen Therapy: Patient Spontanous Breathing and Patient connected to face mask oxygen  Post-op Assessment: Report given to PACU RN, Post -op Vital signs reviewed and stable and Patient moving all extremities  Post vital signs: Reviewed and stable  Complications: No apparent anesthesia complications

## 2013-12-11 NOTE — Op Note (Signed)
Preoperative diagnosis: clinical stage I left breast cancer Postoperative diagnosis: same as above Procedure: 1. Left breast wire guided partial mastectomy 2. Left axillary sentinel node biopsy Surgeon: Dr Serita Grammes Anesthesia: general with LMA Estimated blood loss: Minimal  Complications: None  Specimens:  #1 left breast tissue marked with paint  #2 left axillary sentinel node x2 with highest count 303 #3 additional posterior margin marked short superior, long lateral double deep Disposition to recovery stable  Sponge count correct at completion   Indications: This is a 63 year old female who had a cortical stage I left breast cancer identified. She was seen in the multidisciplinary clinic and we decided to proceed with lumpectomy and sentinel node biopsy risks and benefits were discussed prior to beginning.  Procedure: After informed consent was obtained the patient was first taken to the breast center. She had a wire placed and I had these mammograms available while I was in the operating room. She was then brought to the surgery center. She was given technetium in a standard periareolar fashion. She was then taken to the operating room. She was given cefazolin. Sequential compression devices were in place. She was placed under general anesthesia with an LMA. Surgical timeout was performed. She was then prepped and draped in the standard sterile surgical fashion.   I first removed the breast cancer. I made a curvilinear incision in the upper outer quadrant of the left breast. I brought the wire in from its remote position. I then removed the wire and the surrounding tissue an attempt to get a clear margin. This was then marked with paint. This was passed off the table and mammogram was taken confirming the removal of the wire and clip. This was confirmed by radiology.Pathology looked at the specimen grossly and they thought that it was close to both the posterior and anterior margin. The  anterior margin is only skin at this point. The posterior margin I did remove this all the way down to muscle and marked this as above. I placed 2 clips deep and one clip around each of the positions of the cavity. I then closed the cavity with 2-0 Vicryl. The dermis was closed with 3-0 Vicryl. The skin was closed with 4 Monocryl and Dermabond was placed over this. I infiltrated Marcaine throughout this cavity.  I then identified the location of the sentinel node. I made a 2 cm incision below the hairline. I carried this through the axillary fascia. There was one hot node that had a count of 303. I removed an additional palpable node although this appeared normal.  There was no background radioactivity present. I obtained hemostasis. I closed the axillary fascia with 2-0 Vicryl. The dermis was closed with 3-0 Vicryl and the skin with 4-0 Monocryl. A sterile dressing was placed. She tolerated this well. Breast binder was placed. She was extubated and transferred to recovery stable.

## 2013-12-11 NOTE — Anesthesia Procedure Notes (Signed)
Procedure Name: LMA Insertion Date/Time: 12/11/2013 1:42 PM Performed by: Donne Hazel, MATTHEW Pre-anesthesia Checklist: Patient identified, Emergency Drugs available, Suction available and Patient being monitored Patient Re-evaluated:Patient Re-evaluated prior to inductionOxygen Delivery Method: Circle System Utilized Preoxygenation: Pre-oxygenation with 100% oxygen Intubation Type: IV induction Ventilation: Mask ventilation without difficulty LMA: LMA inserted LMA Size: 3.0 Number of attempts: 1 Airway Equipment and Method: bite block Placement Confirmation: positive ETCO2 and breath sounds checked- equal and bilateral Tube secured with: Tape Dental Injury: Teeth and Oropharynx as per pre-operative assessment

## 2013-12-11 NOTE — H&P (View-Only) (Signed)
Patient ID: Jessica [name], female   DOB: 1951/01/15, 63 y.o.   MRN: [id number]  Chief Complaint  Patient presents with  . Other    HPI Jessica [name] is a 63 y.o. female.  Referred by Dr Earle Gell HPI 66 yof who states she had a right breast cancer 20 years ago but this was apparently just treated with surgery.  She has felt a left breast mass for the last year that has not changed. She was seen by [name] and referred to breast center.  On mm and u/s there is a 1.4 cm lobulated mass present.  MR shows a 1.3x1.2x.9 cm mass in the left breast.  The right breast is normal.  There are no abnormal nodes present.  She has no other complaints referable to either breast.  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 Cancer     BREAST CANCER 1990'S  . Thyroid nodule     BENIGN-DR CORNELL  . Hypertension     DR. KINGSLEY  . Anxiety   . Depression   . Vitamin D deficiency   . Smoker   . Osteoporosis 2009    -2.6 SPINE  . Hyperlipidemia   . GERD (gastroesophageal reflux disease)   . Hot flashes     Past Surgical History  Procedure Laterality Date  . Arm surgery      AT AGE 70-PLATE INSERTED-DUE TO FALL  . Gynecologic cryosurgery  1980    CYTOTHERAPY OF CERVIX    Family History  Problem Relation Age of Onset  . Cancer Mother     COLON  . Diabetes Sister   . Cancer Sister     COLON  . Heart disease Brother   . Cancer Father     throat    Social History History  Substance Use Topics  . Smoking status: Current Every Day Smoker -- 0.50 packs/day  . Smokeless tobacco: Not on file  . Alcohol Use: Yes    Allergies  Allergen Reactions  . Aspirin Nausea And Vomiting  . Effexor [Venlafaxine Hydrochloride] Other (See Comments)    Sees things   . Ivp Dye [Iodinated Diagnostic Agents]     Happened 1970s per pt  . Penicillins     REACTION: hives/tongue swelled     Current Outpatient Prescriptions  Medication Sig Dispense Refill  . acetaminophen (ARTHRITIS PAIN RELIEF) 650 MG CR tablet Take 650 mg by mouth every 8 (eight) hours as needed. pain      . amLODipine (NORVASC) 5 MG tablet Take 5 mg by mouth daily.        . Cholecalciferol (VITAMIN D) 2000 UNITS CAPS Take by mouth.        . EPINEPHrine (EPI-PEN) 0.3 mg/0.3 mL DEVI Inject 0.3 mLs (0.3 mg total) into the muscle once.  2 Device  3  . hydrochlorothiazide (HYDRODIURIL) 25 MG tablet Take 25 mg by mouth at bedtime.      Marland Kitchen levothyroxine (SYNTHROID) 100 MCG tablet Take 1 tablet (100 mcg total) by mouth daily.  60 tablet  1  . Multiple Vitamins-Minerals (MULTIVITAMIN WITH MINERALS) tablet Take 1 tablet by mouth daily.      Marland Kitchen PARoxetine HCl (PAXIL PO) Take 10 mg by mouth See admin instructions. Pt takes only first 2 weeks of the month      . Vitamin D, Ergocalciferol, (DRISDOL) 50000 UNITS CAPS  No current facility-administered medications for this visit.    Review of Systems Review of Systems  Constitutional: Negative for fever, chills and unexpected weight change.  HENT: Negative for congestion, hearing loss, sore throat, trouble swallowing and voice change.   Eyes: Negative for visual disturbance.  Respiratory: Negative for cough and wheezing.   Cardiovascular: Negative for chest pain, palpitations and leg swelling.  Gastrointestinal: Negative for nausea, vomiting, abdominal pain, diarrhea, constipation, blood in stool, abdominal distention and anal bleeding.  Genitourinary: Negative for hematuria, vaginal bleeding and difficulty urinating.  Musculoskeletal: Negative for arthralgias.  Skin: Negative for rash and wound.  Neurological: Negative for seizures, syncope and headaches.  Hematological: Negative for adenopathy. Does not bruise/bleed easily.  Psychiatric/Behavioral: Negative for confusion.    There were no vitals taken for this visit.  Physical Exam Physical Exam  Vitals  reviewed. Constitutional: She appears well-developed and well-nourished.  Neck: Neck supple.  Cardiovascular: Normal rate, regular rhythm and normal heart sounds.   Pulmonary/Chest: Effort normal and breath sounds normal. She has no wheezes. She has no rales. Right breast exhibits no inverted nipple, no mass, no nipple discharge, no skin change and no tenderness. Left breast exhibits mass. Left breast exhibits no inverted nipple, no nipple discharge, no skin change and no tenderness.    Lymphadenopathy:    She has no cervical adenopathy.    Data Reviewed EXAM:  BILATERAL BREAST MRI WITH AND WITHOUT CONTRAST  LABS: BUN and creatinine were obtained on site at [facility] at  [address]  Results: BUN 7 mg/dL, Creatinine 0.6 mg/dL.  TECHNIQUE:  Multiplanar, multisequence MR images of both breasts were obtained  prior to and following the intravenous administration of 40m of  MultiHance  THREE-DIMENSIONAL MR IMAGE RENDERING ON INDEPENDENT WORKSTATION:  Three-dimensional MR images were rendered by post-processing of the  original MR data on an independent workstation. The  three-dimensional MR images were interpreted, and findings are  reported in the following complete MRI report for this study. Three  dimensional images were evaluated at the independent DynaCad  workstation  COMPARISON: Previous exams  FINDINGS:  Breast composition: c: Heterogeneous fibroglandular tissue  Background parenchymal enhancement: Moderate. There is mild motion  present.  Right breast: No mass or abnormal enhancement.  Left breast: There is an irregular enhancing mass with central clip  artifact located within the upper outer quadrant left breast  (axillary tail) measuring 1.3 x 1.2 x 0.9 cm in size. This is  associated with wash in/washout enhancement kinetics. There are no  additional left breast masses or abnormalities.  Lymph nodes: No abnormal appearing lymph nodes.  Ancillary  findings: None.  IMPRESSION:  1.3 cm enhancing mass located within the upper outer quadrant of the  left breast corresponding to the recently diagnosed left breast  invasive ductal carcinoma. No evidence for adenopathy and no  additional findings.    Assessment    Left breast cancer, clinical stage I     Plan    Left breast wire guided lumpectomy, left axillary sentinel node biopsy  We discussed the staging and pathophysiology of breast cancer. We discussed all of the different options for treatment for breast cancer including surgery, chemotherapy, radiation therapy, Herceptin, and antiestrogen therapy. Im not sure she actually had a breast cancer on the right side.  Will try to get her records from that.  We discussed a sentinel lymph node biopsy as she does not appear to having lymph node involvement right now. We discussed the  performance of that with injection of radioactive tracer and blue dye. We discussed that she would have an incision underneath her axillary hairline. We discussed that there is a chance of having a positive node with a sentinel lymph node biopsy and we will await the permanent pathology to make any other first further decisions in terms of her treatment. One of these options might be to return to the operating room to perform an axillary lymph node dissection. We discussed up to a 5% risk lifetime of chronic shoulder pain as well as lymphedema associated with a sentinel lymph node biopsy.  We discussed the options for treatment of the breast cancer which included lumpectomy versus a mastectomy. We discussed the performance of the lumpectomy with a wire placement. We discussed a 5-10% chance of a positive margin requiring reexcision in the operating room. We also discussed that she may need radiation therapy or antiestrogen therapy or both if she undergoes lumpectomy. We discussed the mastectomy and the postoperative care for that as well. We discussed that there is no  difference in her survival whether she undergoes lumpectomy with radiation therapy or antiestrogen therapy versus a mastectomy. We discussed the risks of operation including bleeding, infection, possible reoperation. She understands her further therapy will be based on what her stages at the time of her operation.          Jakayla Schweppe 11/22/2013, 10:14 AM

## 2013-12-11 NOTE — Anesthesia Preprocedure Evaluation (Signed)
Anesthesia Evaluation  Patient identified by MRN, date of birth, ID band Patient awake    Reviewed: Allergy & Precautions, H&P , NPO status , Patient's Chart, lab work & pertinent test results  Airway Mallampati: II  Neck ROM: full    Dental   Pulmonary Current Smoker,          Cardiovascular hypertension,     Neuro/Psych Anxiety Depression    GI/Hepatic GERD-  ,  Endo/Other    Renal/GU      Musculoskeletal   Abdominal   Peds  Hematology   Anesthesia Other Findings   Reproductive/Obstetrics                           Anesthesia Physical Anesthesia Plan  ASA: II  Anesthesia Plan: General   Post-op Pain Management:    Induction: Intravenous  Airway Management Planned: LMA  Additional Equipment:   Intra-op Plan:   Post-operative Plan:   Informed Consent: I have reviewed the patients History and Physical, chart, labs and discussed the procedure including the risks, benefits and alternatives for the proposed anesthesia with the patient or authorized representative who has indicated his/her understanding and acceptance.     Plan Discussed with: CRNA, Anesthesiologist and Surgeon  Anesthesia Plan Comments:         Anesthesia Quick Evaluation

## 2013-12-11 NOTE — Discharge Instructions (Signed)
°Post Anesthesia Home Care Instructions ° °Activity: °Get plenty of rest for the remainder of the day. A responsible adult should stay with you for 24 hours following the procedure.  °For the next 24 hours, DO NOT: °-Drive a car °-Operate machinery °-Drink alcoholic beverages °-Take any medication unless instructed by your physician °-Make any legal decisions or sign important papers. ° °Meals: °Start with liquid foods such as gelatin or soup. Progress to regular foods as tolerated. Avoid greasy, spicy, heavy foods. If nausea and/or vomiting occur, drink only clear liquids until the nausea and/or vomiting subsides. Call your physician if vomiting continues. ° °Special Instructions/Symptoms: °Your throat may feel dry or sore from the anesthesia or the breathing tube placed in your throat during surgery. If this causes discomfort, gargle with warm salt water. The discomfort should disappear within 24 hours. ° ° ° ° ° °[facility] °Office Phone Number [phone] ° °BREAST BIOPSY/ PARTIAL MASTECTOMY: POST OP INSTRUCTIONS ° °Always review your discharge instruction sheet given to you by the facility where your surgery was performed. ° °IF YOU HAVE DISABILITY OR FAMILY LEAVE FORMS, YOU MUST BRING THEM TO THE OFFICE FOR PROCESSING.  DO NOT GIVE THEM TO YOUR DOCTOR. ° °1. A prescription for pain medication may be given to you upon discharge.  Take your pain medication as prescribed, if needed.  If narcotic pain medicine is not needed, then you may take acetaminophen (Tylenol), naprosyn (Alleve) or ibuprofen (Advil) as needed. °2. Take your usually prescribed medications unless otherwise directed °3. If you need a refill on your pain medication, please contact your pharmacy.  They will contact our office to request authorization.  Prescriptions will not be filled after 5pm or on week-ends. °4. You should eat very light the first 24 hours after surgery, such as soup, crackers, pudding, etc.  Resume your  normal diet the day after surgery. °5. Most patients will experience some swelling and bruising in the breast.  Ice packs and a good support bra will help.  Wear the breast binder provided or a sports bra for 72 hours day and night.  After that wear a sports bra during the day until you return to the office. Swelling and bruising can take several days to resolve.  °6. It is common to experience some constipation if taking pain medication after surgery.  Increasing fluid intake and taking a stool softener will usually help or prevent this problem from occurring.  A mild laxative (Milk of Magnesia or Miralax) should be taken according to package directions if there are no bowel movements after 48 hours. °7. Unless discharge instructions indicate otherwise, you may remove your bandages 48 hours after surgery and you may shower at that time.  You may have steri-strips (small skin tapes) in place directly over the incision.  These strips should be left on the skin for 7-10 days and will come off on their own.  If your surgeon used skin glue on the incision, you may shower in 24 hours.  The glue will flake off over the next 2-3 weeks.  Any sutures or staples will be removed at the office during your follow-up visit. °8. ACTIVITIES:  You may resume regular daily activities (gradually increasing) beginning the next day.  Wearing a good support bra or sports bra minimizes pain and swelling.  You may have sexual intercourse when it is comfortable. °a. You may drive when you no longer are taking prescription pain medication, you can comfortably wear a seatbelt, and you   you can safely maneuver your car and apply brakes. b. RETURN TO WORK:  ______________________________________________________________________________________ 9. You should see your doctor in the office for a follow-up appointment approximately two weeks after your surgery.  Your doctors nurse will typically make your follow-up appointment when she calls you with  your pathology report.  Expect your pathology report 3-4 business days after your surgery.  You may call to check if you do not hear from [place] after three days. 10. OTHER INSTRUCTIONS: _______________________________________________________________________________________________ _____________________________________________________________________________________________________________________________________ _____________________________________________________________________________________________________________________________________ _____________________________________________________________________________________________________________________________________  WHEN TO CALL DR WAKEFIELD: 1. Fever over 101.0 2. Nausea and/or vomiting. 3. Extreme swelling or bruising. 4. Continued bleeding from incision. 5. Increased pain, redness, or drainage from the incision.  The clinic staff is available to answer your questions during regular business hours.  Please dont hesitate to call and ask to speak to one of the nurses for clinical concerns.  If you have a medical emergency, go to the nearest emergency room or call 911.  A surgeon from [facility] is always on call at the hospital.  For further questions, please visit [email or web address]

## 2013-12-12 ENCOUNTER — Encounter: Payer: Self-pay | Admitting: Women's Health

## 2013-12-13 ENCOUNTER — Encounter (HOSPITAL_BASED_OUTPATIENT_CLINIC_OR_DEPARTMENT_OTHER): Payer: Self-pay | Admitting: General Surgery

## 2013-12-14 ENCOUNTER — Telehealth: Payer: Self-pay | Admitting: *Deleted

## 2013-12-14 NOTE — Telephone Encounter (Signed)
Pt has bone density scheduled on 12/21/13. Pt just had surgery on Monday for breast cancer and is going to postpone dexa for now until about march. Pt said if you want to call her you can at [phone]

## 2013-12-14 NOTE — Telephone Encounter (Signed)
Telephone call, states is going to be starting 5 times a week for 3 months radiation in several weeks. Is healing well. 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 Will schedule bone density in 6 months.

## 2013-12-14 NOTE — Telephone Encounter (Signed)
error 

## 2013-12-18 ENCOUNTER — Encounter: Payer: Self-pay | Admitting: *Deleted

## 2013-12-18 NOTE — Progress Notes (Signed)
Received order from Dr. Jana Hakim for oncotype dx.  Sent to pathology.  Received by Edmonia Lynch.  Faxed PAC to [facility].

## 2013-12-21 ENCOUNTER — Ambulatory Visit: Payer: Federal, State, Local not specified - PPO | Admitting: Oncology

## 2013-12-21 ENCOUNTER — Telehealth: Payer: Self-pay | Admitting: *Deleted

## 2013-12-21 NOTE — Telephone Encounter (Signed)
Called and spoke with patient concerning the oncotype test. It seems she got very upset when she was called about her contribution to the cost of the oncotype test.  Explained that this test is used to determine whether she would need chemotherapy or not and according to Dr. Virgie Dad note this was discussed. I also explained to her that there is assistance with the cost for the test.  She states that it is vaguely coming back to her, but there was a lot of information thrown at her at one time. She stated it just caught her off guard someone calling her telling her she would be responsible for $600 for a test she did not know what it was for.  Was able to calm the patient give support.  She is willing to discuss this again and do what is necessary.

## 2013-12-21 NOTE — Telephone Encounter (Signed)
Received a call from Levada Dy at [facility] stating that she called to speak with the patient about her benefits with the Oncotype Dx test and told the pt her out of pocket expense and tried to go over their financial assistant program with the patient and then the patient stated to scream at [facility] and say that she did not know anything about this test or why it was done or how she got her information and then she hung up the phone.  I informed Varney Biles and she will call the pt to follow up.

## 2013-12-22 ENCOUNTER — Telehealth: Payer: Self-pay | Admitting: *Deleted

## 2013-12-22 NOTE — Telephone Encounter (Signed)
Received signed PAC back from [facility] and faxed PAC, Path & Office note to Levada Dy at [facility]. Filed paperwork.

## 2013-12-22 NOTE — Telephone Encounter (Signed)
Received a call from Catherine at [facility] in question about what to do with the specimen.  I informed him that Varney Biles spoke with the pt yesterday and she is willing to speak with the Insurance person from there now.  John said that he would get the information over to them to call the patient to see if they can get a verbal to complete the test. He gave me a new ETA date and I gave to [facility] so she could see if results would be back before the f/u appt.

## 2013-12-25 ENCOUNTER — Encounter: Payer: Self-pay | Admitting: *Deleted

## 2013-12-25 ENCOUNTER — Encounter (HOSPITAL_COMMUNITY): Payer: Self-pay

## 2013-12-25 ENCOUNTER — Telehealth (INDEPENDENT_AMBULATORY_CARE_PROVIDER_SITE_OTHER): Payer: Self-pay

## 2013-12-25 ENCOUNTER — Encounter: Payer: Self-pay | Admitting: Radiation Oncology

## 2013-12-25 DIAGNOSIS — C50919 Malignant neoplasm of unspecified site of unspecified female breast: Secondary | ICD-10-CM | POA: Insufficient documentation

## 2013-12-25 NOTE — Progress Notes (Signed)
Location of Breast Cancer: left 2 o'clock  Histology per Pathology Report:  11/06/13 Diagnosis Breast, left, needle core biopsy, mass - INVASIVE DUCTAL CARCINOMA, SEE COMMENT.  12/11/13 Diagnosis 1. Breast, lumpectomy, Left - INVASIVE DUCTAL CARCINOMA GRADE III/III, SPANNING 1.2 CM. - DUCTAL CARCINOMA IN SITU, HIGH GRADE. - THE SURGICAL RESECTION MARGINS ARE NEGATIVE FOR CARCINOMA. - SEE ONCOLOGY TABLE BELOW. 2. Lymph node, sentinel, biopsy, Left node #1 - THERE IS NO EVIDENCE OF CARCINOMA IN 1 OF 1 LYMPH NODE (0/1). 3. Lymph node, sentinel, biopsy, Left node #2 - THERE IS NO EVIDENCE OF CARCINOMA IN 1 OF 1 LYMPH NODE (0/1). 4. Breast, excision, Left posterior margin - BENIGN BREAST PARENCHYMA AND SKELETAL MUSCLE. - THERE IS NO EVIDENCE OF MALIGNANCY. - SEE COMMENT.  Receptor Status: ER(80%), PR (90%), Her2-neu (-)  Did patient present with symptoms (if so, please note symptoms) or was this found on screening mammography?: pt noted mass last Oct-Nov.  Past/Anticipated interventions by surgeon, if any: 12/11/13  left lumpectomy, re-excision, 2 nodes sampled  Past/Anticipated interventions by medical oncology, if any: Chemotherapy , Dr Jana Hakim- antiestrogen therapy, no benefit from anti-HER- 2 immunotherapy. Pt has appt w/Dr Magrinat on 12/28/13.  Lymphedema issues, if any:   no  Pain issues, if any:  Post op soreness, tenderness of left breast, axilla, underneath left arm, denies pain  SAFETY ISSUES:  Prior radiation? no  Pacemaker/ICD? no  Possible current pregnancy? no  Is the patient on methotrexate? 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     Jessica Rhein, RN 12/25/2013,11:12 AM

## 2013-12-25 NOTE — Telephone Encounter (Signed)
Message copied by Illene Regulus on Mon Dec 25, 2013  9:55 AM ------      Message from: Donne Hazel, MATTHEW      Created: Mon Dec 25, 2013  9:18 AM      Regarding: FW: [name] needs ABC class       Could you call her and encourage her please      ----- Message -----         From: Toribio Harbour         Sent: 12/20/2013   4:01 PM           To: [name], MD      Subject: [name] needs ABC class                            Dr. Donne Hazel - I spoke with this pt today and tried to get her to agree to come to the [facility] class and for arm measurements on 2/16.  She said she wants to get back to work and probably doesn't have time to come.  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  She said she'll talk to you about it when she sees you 2/11.  Could you please encourage her to come?  Thanks - Mart       ------

## 2013-12-25 NOTE — Telephone Encounter (Signed)
Called pt to talk to her about attending the ABC class. I advised pt that Dr Donne Hazel wanted me to call her so we could see about going to see Inez Catalina this week if possible since Inez Catalina stated that the pt said she probably wouldn't go since pt needed to return to work. The pt told me real quick that is not what she said and that she didn't appreciate [place] not having the information correct b/c the pt said she never said she wouldn't go see Inez Catalina she wants to see Dr Donne Hazel first for her appt on 12/27/13. The pt said she knows it's very important to attend the ABC class and she is the one that volunteered for the program. The pt said she was given an appt to the ABC class the same time as her appt with Dr Donne Hazel so that is when she said she couldn't make it to the ABC class. The pt said she would talk to Dr Donne Hazel on Wednesday first b/c she is not driving yet either since the seatbelt comes across her chest it's still too sore for the seatbelt. The pt seemed ok when getting off the phone and she said again that she never said she wouldn't attend the ABC class.

## 2013-12-25 NOTE — Progress Notes (Signed)
Received Oncotype Dx results of 16.  Emailed Dr. Jana Hakim w/ results and took a copy of results and PAC to Med Rec to scan.

## 2013-12-26 ENCOUNTER — Ambulatory Visit
Admission: RE | Admit: 2013-12-26 | Discharge: 2013-12-26 | Disposition: A | Discharge status: Home / Self Care | Payer: Federal, State, Local not specified - PPO | Source: Ambulatory Visit | Attending: Radiation Oncology | Admitting: Radiation Oncology

## 2013-12-26 ENCOUNTER — Telehealth (INDEPENDENT_AMBULATORY_CARE_PROVIDER_SITE_OTHER): Payer: Self-pay | Admitting: General Surgery

## 2013-12-26 ENCOUNTER — Encounter: Payer: Self-pay | Admitting: Women's Health

## 2013-12-26 ENCOUNTER — Encounter: Payer: Self-pay | Admitting: Radiation Oncology

## 2013-12-26 VITALS — BP 160/81 | HR 55 | Temp 98.1°F | Resp 20 | Wt 106.0 lb

## 2013-12-26 DIAGNOSIS — C50412 Malignant neoplasm of upper-outer quadrant of left female breast: Secondary | ICD-10-CM

## 2013-12-26 DIAGNOSIS — Z9010 Acquired absence of unspecified breast and nipple: Secondary | ICD-10-CM | POA: Insufficient documentation

## 2013-12-26 DIAGNOSIS — Z170 Estrogen receptor positive status [ER+]: Secondary | ICD-10-CM | POA: Insufficient documentation

## 2013-12-26 DIAGNOSIS — C50919 Malignant neoplasm of unspecified site of unspecified female breast: Secondary | ICD-10-CM | POA: Insufficient documentation

## 2013-12-26 HISTORY — DX: Malignant neoplasm of unspecified site of unspecified female breast: C50.919

## 2013-12-26 NOTE — Addendum Note (Signed)
Encounter addended by: Andria Rhein, RN on: 12/26/2013  2:21 PM<BR>     Documentation filed: Charges VN

## 2013-12-26 NOTE — Progress Notes (Signed)
Please see the Nurse Progress Note in the MD Initial Consult Encounter for this patient. 

## 2013-12-26 NOTE — Progress Notes (Signed)
CC: Dr. [name], Dr. 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 She underwent mammography at [facility] on 10/23/2012 showing a new irregular mass. Ultrasound showed a 1.4 cm mass at 2:00 posteriorly. A biopsy on 11/06/2013 was diagnostic for invasive ductal carcinoma which was ER positive at 80%, PR +90% with a proliferation Ki-67 of 10%. Breast MR showed a 1.3 mass at 3:00, posteriorly. Of note is that she does give a history of what sounds like a benign breast biopsy by Dr. Bubba Camp approximately 17 years ago. She did not receive adjuvant radiation therapy or chemotherapy/hormone therapy. Dr. Donne Hazel performed a left partial mastectomy and sentinel lymph node biopsy on 12/11/2013. She was found to have a 1.2 cm invasive ductal carcinoma, grade 3/3 along with high-grade DCIS. The closest margin for invasive disease was 0.2 cm (all margins) and 0.2 cm for DCIS as well. 2 sentinel lymph nodes were free of metastatic disease. Oncotype DX score was 16 (low risk) indicating a 10% chance for a distant recurrence. She is without complaints today. She'll see Dr. Donne Hazel tomorrow and Dr. Jana Hakim this Thursday for discussion of adjuvant chemotherapy.  Physical examination: Alert and oriented. Filed Vitals:   12/26/13 0833  BP: 160/81  Pulse: 55  Temp: 98.1 F (36.7 C)  Resp: 20   Head and neck examination: Grossly unremarkable. Nodes: There is no palpable cervical, supraclavicular, or axillary lymphadenopathy. Chest: Lungs clear. Breasts: There is a  partial mastectomy wound at approximately 1 to 2:00 along the upper-outer quadrant of the left breast. The wound is healing well. No dominant masses are appreciated. The left sentinel lymph node  biopsy wound is also healing well. Extremities: Without edema.  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 We again discussed her local management options which include mastectomy versus partial mastectomy followed by radiation therapy. We discussed hypo-fractionated radiation therapy versus standard fractionation. The [place] hypofractionation trial showed a 15% local failure rate in patients with high grade disease, but the numbers of patients with high-grade disease with small. There is some controversy as to appropriate fractionation for patients with high-grade disease. I do favor standard fractionation in this setting. I also recommend that she undergo electron beam boost for her close margins. We discussed the potential acute and late toxicities of radiation therapy. Consent is signed today. She will visit Dr. Jana Hakim later this week for discussion of adjuvant chemotherapy in addition to anti-estrogen therapy. If she does not have chemotherapy we will get her back for simulation/treatment planning next week.  30 minutes was spent face-to-face with the patient, primarily counseling patient and coordinating her care.

## 2013-12-26 NOTE — Telephone Encounter (Signed)
Pt called to report the glue came off her lymph node surgical wound today.  Wants to know if she needs to cover with dressing or not.  The wound is closed and looks good.  Instructed pt okay to leave open to air, but if clothing irritates the area, can cover it for comfort.  She understands.

## 2013-12-27 ENCOUNTER — Encounter (INDEPENDENT_AMBULATORY_CARE_PROVIDER_SITE_OTHER): Payer: Self-pay | Admitting: General Surgery

## 2013-12-27 ENCOUNTER — Ambulatory Visit (INDEPENDENT_AMBULATORY_CARE_PROVIDER_SITE_OTHER): Payer: Federal, State, Local not specified - PPO | Admitting: General Surgery

## 2013-12-27 ENCOUNTER — Encounter (INDEPENDENT_AMBULATORY_CARE_PROVIDER_SITE_OTHER): Payer: Self-pay

## 2013-12-27 VITALS — BP 148/80 | HR 80 | Resp 16 | Ht 63.0 in | Wt 108.0 lb

## 2013-12-27 DIAGNOSIS — Z09 Encounter for follow-up examination after completed treatment for conditions other than malignant neoplasm: Secondary | ICD-10-CM

## 2013-12-27 NOTE — Progress Notes (Signed)
Subjective:     Patient ID: Jessica [name], female   DOB: 1950-12-31, 63 y.o.   MRN: [id number]  HPI 63 yof who recently underwent a left breast lumpectomy and sentinel node biopsy For a stage I left breast cancer. She has done remarkably well. She returns today without any complaints. She wants to go back to work. She has no warm swelling. She has full range of motion of her left upper chest remedy at this point. We discussed her pathology and I provided her a report today. This is a grade 3 invasive ductal carcinoma with negative margins and a negative node.  Review of Systems     Objective:   Physical Exam Healing left breast and left axillary incision without any evidence of infection    Assessment:     Stage I left breast cancer status post lumpectomy and sentinel node biopsy     Plan:     She can return to normal activity. I will see her back in one year or sooner if needed. She is going to see medical oncology tomorrow for evaluation for adjuvant therapy and has been seen by radiation therapy and can begin whenever she is ready. She is also going to contact physical therapy for a followup appointment. We discussed this today.

## 2013-12-28 ENCOUNTER — Other Ambulatory Visit: Payer: Self-pay | Admitting: Women's Health

## 2013-12-28 ENCOUNTER — Telehealth: Payer: Self-pay | Admitting: Women's Health

## 2013-12-28 ENCOUNTER — Ambulatory Visit (HOSPITAL_BASED_OUTPATIENT_CLINIC_OR_DEPARTMENT_OTHER): Payer: Federal, State, Local not specified - PPO | Admitting: Oncology

## 2013-12-28 ENCOUNTER — Telehealth: Payer: Self-pay | Admitting: *Deleted

## 2013-12-28 VITALS — BP 137/76 | HR 52 | Temp 97.7°F | Resp 20 | Ht 63.0 in | Wt 108.1 lb

## 2013-12-28 DIAGNOSIS — M810 Age-related osteoporosis without current pathological fracture: Secondary | ICD-10-CM

## 2013-12-28 DIAGNOSIS — C50619 Malignant neoplasm of axillary tail of unspecified female breast: Secondary | ICD-10-CM

## 2013-12-28 DIAGNOSIS — C50919 Malignant neoplasm of unspecified site of unspecified female breast: Secondary | ICD-10-CM

## 2013-12-28 DIAGNOSIS — Z170 Estrogen receptor positive status [ER+]: Secondary | ICD-10-CM

## 2013-12-28 NOTE — Telephone Encounter (Signed)
appts made and printed...td 

## 2013-12-28 NOTE — Progress Notes (Signed)
[place]  [phone]) [phone] [phone]) [phone]     ID: Jessica [name] OB: 08-18-1951  MR#: [id number]  DUK#:[id number]  PCP: Jessica Heck, MD GYN:  Jessica [name] SU: Jessica [name]; Jessica [name] OTHER MD: Jessica [name]; Jessica [name]  CHIEF COMPLAINT: "I have a mass that has been there for a while".  HISTORY OF PRESENT ILLNESS: "Jessica [name]" has noted a mass in her left breast for at least several months. She didn't think it was worth bringing to medical attention. When she went for routine gynecologic followup under Jessica Alas NP, this was palpated in the patient was set up for bilateral diagnostic mammography 10/23/2013 at [facility]. She was found to have breast density category C. A new irregular mass was noted in the left axillary tail. Ultrasound was obtained the same day and showed a 1.4 cm lobulated mass in the left breast at the 2:00 position. 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  On 11/13/2013 the patient underwent bilateral breast MRI. This showed a 1.3 cm irregular enhancing mass in the upper outer quadrant of the left breast (axillary tail). There were no other masses of concern in either breast and no abnormal appearing lymph nodes.  The patient's subsequent history is as detailed below.  INTERVAL HISTORY: Jessica [name] returns today for followup of her breast cancer accompanied by her husband Jessica [name]. Since her last visit here, on 12/11/2013, she underwent left lumpectomy and sentinel lymph node sampling, with a pathology ([id number]) showing a 1.2 cm invasive ductal carcinoma, grade 3, with both sentinel lymph nodes clear, and negative/adequate  margins. Repeat HER-2 was again negative. An Oncotype DX score of 16 predicted a 10 year risk of outside the breast recurrence of 10% of the patient's only systemic treatment was tamoxifen for 5 years. It also predicted no significant benefit from chemotherapy  REVIEW OF SYSTEMS: Jessica [name] is very pleased with her surgery. She had practically no pain, certainly no fever or erythema. She is pleased with the cosmetic results. She is getting ready to get back to work next week. A detailed review of systems today was otherwise  PAST MEDICAL HISTORY: Past Medical History  Diagnosis Date  . Cancer     BREAST CANCER 1990'S  . Thyroid nodule     BENIGN-DR CORNELL  . Hypertension     DR. KINGSLEY  . Anxiety   . Depression   . Vitamin D deficiency   . Smoker   . Osteoporosis 2009    -2.6 SPINE  . Hyperlipidemia   . GERD (gastroesophageal reflux disease)   . Hot flashes   . Breast cancer 11/06/13    left    PAST SURGICAL HISTORY: Past Surgical History  Procedure Laterality Date  . Arm surgery      AT AGE 37-PLATE INSERTED-DUE TO FALL-lt  . Gynecologic cryosurgery  1980    CYTOTHERAPY OF CERVIX  . Breast surgery  1994    rt lump  . Tubal ligation    . Biopsy thyroid    . Breast lumpectomy with needle localization and axillary sentinel lymph node bx Left 12/11/2013    Procedure: BREAST LUMPECTOMY WITH NEEDLE LOCALIZATION AND AXILLARY SENTINEL LYMPH NODE BX;  Surgeon: Jessica Bookbinder, MD;  Location: [facility];  Service: General;  Laterality: Left;    FAMILY HISTORY Family History  Problem Relation Age of Onset  . Cancer Mother     COLON  . Diabetes Sister   . Cancer Sister     COLON  . Heart disease Brother   . Cancer Father     throat  . Cancer Sister     stomach   the patient's father died in his mid 58s with cancer of the throat. The patient's mother died at the age of 32 with colon cancer. The patient had 4 brothers and 5 sisters. One sister was diagnosed  with stomach cancer but the patient does not know at what age. One sister was diagnosed with colon cancer at age 36. There is no history of breast or ovarian cancer in the family to the patient's knowledge.  GYNECOLOGIC HISTORY:  Menarche age 35, first live birth age 23, the patient is Jessica [name] P4. She thinks she went through menopause approximately age 51. She did not take hormone replacement.  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 Her husband Jessica [name] (goes by Jessica [name]) is retired from the [employer]. He works part-time as a Horticulturist, commercial. Daughter Jessica [name] lives in [place] where she works as a Product/process development scientist. Daughter Jessica [name] lives in [place] where she works as a Cabin crew. Daughter Jessica [name] works as a Marine scientist in [employer] here in [place]. Daughter Jessica [name] works as a Emergency planning/management officer in [place]. The patient has 7 grandchildren. She is a Psychologist, forensic.     ADVANCED DIRECTIVES: In place   HEALTH MAINTENANCE: History  Substance Use Topics  . Smoking status: Current Every Day Smoker -- 0.50 packs/day for 43 years  . Smokeless tobacco: Not on file     Comment: 12/26/13 1/4- 1/2 PPD  . Alcohol Use: Yes     Colonoscopy:2005  PAP:2014  Bone density:Remote  Lipid panel:  Allergies  Allergen Reactions  . Aspirin Nausea And Vomiting  . Effexor [Venlafaxine Hydrochloride] Other (See Comments)    Sees things   . Ivp Dye [Iodinated Diagnostic Agents]     Happened 1970s per pt-sob  . Penicillins     REACTION: hives/tongue swelled    Current Outpatient Prescriptions  Medication Sig Dispense Refill  . acetaminophen (ARTHRITIS PAIN RELIEF) 650 MG CR tablet Take 650 mg by mouth every 8 (eight) hours as needed. pain      . amLODipine (NORVASC) 5 MG tablet Take 5 mg by mouth daily.        . Cholecalciferol (VITAMIN D) 2000 UNITS CAPS Take by mouth.        . EPINEPHrine (EPI-PEN) 0.3 mg/0.3 mL DEVI Inject 0.3 mLs (0.3 mg total)  into the muscle once.  2 Device  3  . hydrochlorothiazide (HYDRODIURIL) 25 MG tablet Take 25 mg by mouth at bedtime.      . Multiple Vitamins-Minerals (MULTIVITAMIN WITH MINERALS) tablet Take 1 tablet by mouth daily.      Marland Kitchen PARoxetine HCl (PAXIL PO) Take 10 mg by mouth See admin instructions. Pt takes only first 2 weeks of the month       No current facility-administered medications for this visit.    OBJECTIVE: Middle-aged [place] American woman who appears stated age 63 Vitals:   12/28/13 0804  BP: 137/76  Pulse: 52  Temp: 97.7 F (36.5 C)  Resp: 20     Body mass index is 19.15 kg/(m^2).    ECOG FS:0 -  Asymptomatic  Ocular: Sclerae unicteric, pupils equal and round Ear-nose-throat: Oropharynx clear moist Lymphatic: No cervical or supraclavicular adenopathy Lungs no rales or rhonchi, good excursion bilaterally Heart regular rate and rhythm, no murmur appreciated Abd soft, nontender, positive bowel sounds MSK no focal spinal tenderness, no joint edema Neuro: non-focal, well-oriented, appropriate affect Breasts: The right breast is status post prior biopsy. There are no suspicious masses, skin changes, or nipple changes of concern. The right axilla is benign. The left breast is status post lumpectomy. The incision is healing nicely. The cosmetic result is good There appears to be a small amount of fluid inferior and laterally to the incision. The left axilla is benign.   LAB RESULTS:  CMP     Component Value Date/Time   NA 140 11/22/2013 0836   K 3.7 11/22/2013 0836   CO2 28 11/22/2013 0836   GLUCOSE 134 11/22/2013 0836   BUN 10.3 11/22/2013 0836   CREATININE 0.8 11/22/2013 0836   CALCIUM 9.8 11/22/2013 0836   PROT 7.5 11/22/2013 0836   ALBUMIN 4.2 11/22/2013 0836   AST 18 11/22/2013 0836   ALT 12 11/22/2013 0836   ALKPHOS 90 11/22/2013 0836   BILITOT 1.06 11/22/2013 0836    I No results found for this basename: SPEP,  UPEP,   kappa and lambda light chains    Lab Results  Component  Value Date   WBC 5.8 11/22/2013   NEUTROABS 2.8 11/22/2013   HGB 13.9 12/11/2013   HCT 40.7 11/22/2013   MCV 89.3 11/22/2013   PLT 251 11/22/2013      Chemistry      Component Value Date/Time   NA 140 11/22/2013 0836   K 3.7 11/22/2013 0836   CO2 28 11/22/2013 0836   BUN 10.3 11/22/2013 0836   CREATININE 0.8 11/22/2013 0836      Component Value Date/Time   CALCIUM 9.8 11/22/2013 0836   ALKPHOS 90 11/22/2013 0836   AST 18 11/22/2013 0836   ALT 12 11/22/2013 0836   BILITOT 1.06 11/22/2013 0836       No results found for this basename: LABCA2    No components found with this basename: LABCA125    No results found for this basename: INR,  in the last 168 hours  Urinalysis No results found for this basename: colorurine,  appearanceur,  labspec,  phurine,  glucoseu,  hgbur,  bilirubinur,  ketonesur,  proteinur,  urobilinogen,  nitrite,  leukocytesur    STUDIES: Nm Sentinel Node Inj-no Rpt (breast)  12/11/2013   CLINICAL DATA: left axillary sentinel node biopsy   Sulfur colloid was injected intradermally by the nuclear medicine  technologist for breast cancer sentinel node localization.     ASSESSMENT: 63 y.o. [place] woman  (1) status post right upper inner quadrant lumpectomy 1994 for "breast cancer"; no data available; patient did not receive radiation, chemotherapy, or antiestrogen treatment  (2) status post left breast biopsy 11/06/2013 for a clinical T1c N0, stage IA invasive ductal carcinoma, grade 2, estrogen and progesterone receptor positive, HER-2 not amplified, with an MIB-1 of 10%  (3) genetics testing pending  (4) status post left lumpectomy and sentinel lymph node biopsy 12/11/2013 for apT1c pN0, stage IA invasive ductal carcinoma, grade 3, repeat HER-2 testing again negative  (5) Oncotype DX score of 16 predicts a 10 year risk of outside the breast recurrence of 10% if the patient's only systemic treatment is tamoxifen for 5 years. Is also predicts no benefit from  chemotherapy.  (6) radiation  therapy in process  (7) anti-estrogens to follow radiation  PLAN: We spent approximately 45 minutes today going over Christean Grief. pathology and Oncotype results. She understands the pathology confirmed that she has a stage I breast cancer, with negative margins. The Oncotype predicts no benefit from chemotherapy. It predicts a 10 year risk of recurrence of 10% outside of the breast if she takes tamoxifen for 5 years.  We discussed the fact that now have better options than tamoxifen for 5 years. His options include tamoxifen for 10 years, anastrozole for 5 years, or a combination of the 2 drugs. She does not have a bone density available and we are going to obtain that through Dr. Toney Rakes office before she returns to see me in 2 months. At that point we will decide whether she should start on tamoxifen or anastrozole.  She is a ready been simulated for radiation. We will not start antiestrogen so until at least 2 weeks after radiation is completed.  Jessica [name] has a good understanding of the overall plan. She agrees with it. She knows a goal of treatment in her case is cure. She will call with any problems that may develop before the next visit here.  Chauncey Cruel, MD   12/28/2013 8:35 AM

## 2013-12-28 NOTE — Telephone Encounter (Signed)
Message copied by [name] on Thu Dec 28, 2013 12:16 PM ------      Message from: Chauncey Cruel      Created: Thu Dec 28, 2013  9:01 AM       Nancy/ Elita Quick, could you obtain a bone density on this patient and send me the results?            Thank you!            GM ------

## 2014-01-02 ENCOUNTER — Ambulatory Visit: Payer: Federal, State, Local not specified - PPO | Admitting: Radiation Oncology

## 2014-01-04 ENCOUNTER — Encounter (INDEPENDENT_AMBULATORY_CARE_PROVIDER_SITE_OTHER): Payer: Self-pay

## 2014-01-04 ENCOUNTER — Telehealth (INDEPENDENT_AMBULATORY_CARE_PROVIDER_SITE_OTHER): Payer: Self-pay

## 2014-01-04 NOTE — Telephone Encounter (Signed)
Patient states she has been having a occasional shooting pain at the surgical site . Advise this is normal due the type of surgery she had and it will eventually go away. Advised for to call if her condition changes.

## 2014-01-08 ENCOUNTER — Ambulatory Visit
Admission: RE | Admit: 2014-01-08 | Discharge: 2014-01-08 | Disposition: A | Discharge status: Home / Self Care | Payer: Federal, State, Local not specified - PPO | Source: Ambulatory Visit | Attending: Radiation Oncology | Admitting: Radiation Oncology

## 2014-01-08 DIAGNOSIS — C50219 Malignant neoplasm of upper-inner quadrant of unspecified female breast: Secondary | ICD-10-CM | POA: Insufficient documentation

## 2014-01-08 DIAGNOSIS — C50211 Malignant neoplasm of upper-inner quadrant of right female breast: Secondary | ICD-10-CM

## 2014-01-08 DIAGNOSIS — R5381 Other malaise: Secondary | ICD-10-CM | POA: Insufficient documentation

## 2014-01-08 DIAGNOSIS — R5383 Other fatigue: Secondary | ICD-10-CM

## 2014-01-08 DIAGNOSIS — Z510 Encounter for antineoplastic radiation therapy: Secondary | ICD-10-CM | POA: Insufficient documentation

## 2014-01-08 DIAGNOSIS — L299 Pruritus, unspecified: Secondary | ICD-10-CM | POA: Insufficient documentation

## 2014-01-08 NOTE — Progress Notes (Signed)
Complex simulation/treatment planning note: The patient was taken to the simulator. She was placed on a custom breast board with a Vac-Lok device and Acuform mold for neck immobilization. Her field borders were marked along her left breast. She was scanned free breathing in the cardiac silhouette is within the tangential fields. She was then scanned with deep inspiration/breath-hold. There is significant movement of the heart away from tangential fields. The CT data set was sent to the planning system for contouring of the left breast tumor bed, partial mastectomy scar, lungs, and heart. I am prescribing 5000 cGy in 25 sessions utilizing 6 MV photons with deep inspiration and breath-hold. This will be followed by electron beam boost for a further 1000 cGy in 5 sessions. She is now ready for 3-D simulation.

## 2014-01-08 NOTE — Progress Notes (Signed)
  Radiation Oncology         [phone] ________________________________  Name: Jessica [name] MRN: [id number]  Date: 01/08/2014  DOB: 1950-12-22  RESPIRATORY MOTION MANAGEMENT SIMULATION  NARRATIVE:  In order to account for effect of respiratory motion on target structures and other organs in the planning and delivery of radiotherapy, this patient underwent respiratory motion management simulation.  To accomplish this, when the patient was brought to the CT simulation planning suite, 4D respiratoy motion management CT images were obtained.  The CT images were loaded into the planning software.  Then, using a variety of tools including Cine, MIP, and standard views, the target volume and planning target volumes (PTV) were delineated.  Avoidance structures were contoured.  Treatment planning then occurred.  Dose volume histograms were generated and reviewed for each of the requested structure.  The resulting plan was carefully reviewed and approved today.

## 2014-01-09 ENCOUNTER — Encounter: Payer: Self-pay | Admitting: Radiation Oncology

## 2014-01-09 NOTE — Progress Notes (Addendum)
3-D simulation note: The patient completed 3-D simulation for treatment of her left breast with deep inspiration/breath-hold. She was planned with electronic compensation. She was set up to tangential fields. 2 sets of multileaf collimators are designed to conform the field. I prescribing 5000 cGy 25 sessions utilizing 6 MV photons. Dose volume histograms were obtained for the target structures and also avoidance structures including the lungs and heart. We met our departmental guidelines. Addendum: The patient had a both electronic and unique MLCs for each tangential field for four complex treatment devices.

## 2014-01-09 NOTE — Progress Notes (Signed)
  Radiation Oncology         [phone] ________________________________  Name: Jessica [name] MRN: [id number]  Date: 01/09/2014  DOB: [birth year]  Optical Surface Tracking Plan:  Since intensity modulated radiotherapy (IMRT) and 3D conformal radiation treatment methods are predicated on accurate and precise positioning for treatment, intrafraction motion monitoring is medically necessary to ensure accurate and safe treatment delivery.  The ability to quantify intrafraction motion without excessive ionizing radiation dose can only be performed with optical surface tracking. Accordingly, surface imaging offers the opportunity to obtain 3D measurements of patient position throughout IMRT and 3D treatments without excessive radiation exposure.  I am ordering optical surface tracking for this patient's upcoming course of radiotherapy. ________________________________  Rexene Edison, MD 01/09/2014 6:58 PM    Reference:   Ursula Alert, J, et al. Surface imaging-based analysis of intrafraction motion for breast radiotherapy patients.Journal of [place], n. 6, nov. 2014. ISSN [id number].   Available at: <http://www.jacmp.org/index.php/jacmp/article/view/4957>.

## 2014-01-15 ENCOUNTER — Ambulatory Visit: Payer: Federal, State, Local not specified - PPO | Admitting: Radiation Oncology

## 2014-01-16 ENCOUNTER — Ambulatory Visit
Admission: RE | Admit: 2014-01-16 | Discharge: 2014-01-16 | Disposition: A | Discharge status: Home / Self Care | Payer: Federal, State, Local not specified - PPO | Source: Ambulatory Visit | Attending: Radiation Oncology | Admitting: Radiation Oncology

## 2014-01-16 ENCOUNTER — Encounter: Payer: Self-pay | Admitting: Radiation Oncology

## 2014-01-16 NOTE — Progress Notes (Signed)
Simulation verification note: The patient underwent simulation verification for treatment to her left breast. Her isocenter is in good position and the multileaf collimators contoured the treatment volume appropriately. 

## 2014-01-17 ENCOUNTER — Ambulatory Visit
Admission: RE | Admit: 2014-01-17 | Discharge: 2014-01-17 | Disposition: A | Discharge status: Home / Self Care | Payer: Federal, State, Local not specified - PPO | Source: Ambulatory Visit | Attending: Radiation Oncology | Admitting: Radiation Oncology

## 2014-01-18 ENCOUNTER — Ambulatory Visit
Admission: RE | Admit: 2014-01-18 | Discharge: 2014-01-18 | Disposition: A | Discharge status: Home / Self Care | Payer: Federal, State, Local not specified - PPO | Source: Ambulatory Visit | Attending: Radiation Oncology | Admitting: Radiation Oncology

## 2014-01-19 ENCOUNTER — Ambulatory Visit
Admission: RE | Admit: 2014-01-19 | Discharge: 2014-01-19 | Disposition: A | Discharge status: Home / Self Care | Payer: Federal, State, Local not specified - PPO | Source: Ambulatory Visit | Attending: Radiation Oncology | Admitting: Radiation Oncology

## 2014-01-19 DIAGNOSIS — C50412 Malignant neoplasm of upper-outer quadrant of left female breast: Secondary | ICD-10-CM

## 2014-01-19 NOTE — Progress Notes (Signed)
Patient education done, rad book, alra and radiaplex gel given to patient and Marletta Lor business card, discussed skin irritation, fatigue, pain, diet,increase protein, stay hydrated, soap with unscented, teach back given, all questions answered 5:05 PM

## 2014-01-22 ENCOUNTER — Ambulatory Visit
Admission: RE | Admit: 2014-01-22 | Discharge: 2014-01-22 | Disposition: A | Discharge status: Home / Self Care | Payer: Federal, State, Local not specified - PPO | Source: Ambulatory Visit | Attending: Radiation Oncology | Admitting: Radiation Oncology

## 2014-01-22 VITALS — BP 118/76 | HR 59 | Temp 97.8°F | Wt 105.5 lb

## 2014-01-22 DIAGNOSIS — C50211 Malignant neoplasm of upper-inner quadrant of right female breast: Secondary | ICD-10-CM

## 2014-01-22 NOTE — Progress Notes (Signed)
Weekly Management Note:  Site: Left breast Current Dose:  800  cGy Projected Dose: 5000  CGy followed by 1000 cGy in 5 sessions boost  Narrative: The patient is seen today for routine under treatment assessment. CBCT/MVCT images/port films were reviewed. The chart was reviewed.   She is without complaints today except for mild to moderate fatigue. She has Radioplex gel to use when necessary.  Physical Examination:  Filed Vitals:   01/22/14 1621  BP: 118/76  Pulse: 59  Temp: 97.8 F (36.6 C)  .  Weight: 105 lb 8 oz (47.854 kg). Faint hyperpigmentation the skin, no areas of desquamation.  Impression: Tolerating radiation therapy well.  Plan: Continue radiation therapy as planned.

## 2014-01-22 NOTE — Progress Notes (Signed)
Patient here for weekly assessment of radiation to left breast.Completd 4 of 25 treatments.No change, denies pain.

## 2014-01-23 ENCOUNTER — Ambulatory Visit
Admission: RE | Admit: 2014-01-23 | Discharge: 2014-01-23 | Disposition: A | Discharge status: Home / Self Care | Payer: Federal, State, Local not specified - PPO | Source: Ambulatory Visit | Attending: Radiation Oncology | Admitting: Radiation Oncology

## 2014-01-24 ENCOUNTER — Ambulatory Visit
Admission: RE | Admit: 2014-01-24 | Discharge: 2014-01-24 | Disposition: A | Discharge status: Home / Self Care | Payer: Federal, State, Local not specified - PPO | Source: Ambulatory Visit | Attending: Radiation Oncology | Admitting: Radiation Oncology

## 2014-01-25 ENCOUNTER — Ambulatory Visit
Admission: RE | Admit: 2014-01-25 | Discharge: 2014-01-25 | Disposition: A | Discharge status: Home / Self Care | Payer: Federal, State, Local not specified - PPO | Source: Ambulatory Visit | Attending: Radiation Oncology | Admitting: Radiation Oncology

## 2014-01-26 ENCOUNTER — Ambulatory Visit
Admission: RE | Admit: 2014-01-26 | Discharge: 2014-01-26 | Disposition: A | Discharge status: Home / Self Care | Payer: Federal, State, Local not specified - PPO | Source: Ambulatory Visit | Attending: Radiation Oncology | Admitting: Radiation Oncology

## 2014-01-29 ENCOUNTER — Ambulatory Visit
Admission: RE | Admit: 2014-01-29 | Discharge: 2014-01-29 | Disposition: A | Discharge status: Home / Self Care | Payer: Federal, State, Local not specified - PPO | Source: Ambulatory Visit | Attending: Radiation Oncology | Admitting: Radiation Oncology

## 2014-01-29 ENCOUNTER — Encounter: Payer: Self-pay | Admitting: Radiation Oncology

## 2014-01-29 VITALS — BP 122/67 | HR 53 | Temp 98.5°F | Resp 16 | Wt 110.1 lb

## 2014-01-29 DIAGNOSIS — C50212 Malignant neoplasm of upper-inner quadrant of left female breast: Secondary | ICD-10-CM

## 2014-01-29 NOTE — Progress Notes (Signed)
Weekly rad txs left breast 9/30 completed, very little skin changes, using radiaplex gel bid now, skin intact, appetite good, drinking fluids, staying hydrated, energy level up and down, Saturday chores, bed in at 7pm and slept till 6am Sunday morning . No c/o pain in breast , somehot flashes occasionally 4:23 PM

## 2014-01-29 NOTE — Progress Notes (Signed)
Weekly Management Note:  Site: Left breast Current Dose:  1800  cGy Projected Dose: 5000  cGy followed by 5 fraction boost  Narrative: The patient is seen today for routine under treatment assessment. CBCT/MVCT images/port films were reviewed. The chart was reviewed.   She is without new complaints today. She does have mild fatigue. She uses Radioplex gel.  Physical Examination:  Filed Vitals:   01/29/14 1619  BP: 122/67  Pulse: 53  Temp: 98.5 F (36.9 C)  Resp: 16  .  Weight: 110 lb 1.6 oz (49.941 kg). There is faint hyperpigmentation the skin along the left breast. No areas of desquamation.  Impression: Tolerating radiation therapy well.  Plan: Continue radiation therapy as planned.

## 2014-01-30 ENCOUNTER — Ambulatory Visit
Admission: RE | Admit: 2014-01-30 | Discharge: 2014-01-30 | Disposition: A | Discharge status: Home / Self Care | Payer: Federal, State, Local not specified - PPO | Source: Ambulatory Visit | Attending: Radiation Oncology | Admitting: Radiation Oncology

## 2014-01-31 ENCOUNTER — Ambulatory Visit
Admission: RE | Admit: 2014-01-31 | Discharge: 2014-01-31 | Disposition: A | Discharge status: Home / Self Care | Payer: Federal, State, Local not specified - PPO | Source: Ambulatory Visit | Attending: Radiation Oncology | Admitting: Radiation Oncology

## 2014-02-01 ENCOUNTER — Ambulatory Visit
Admission: RE | Admit: 2014-02-01 | Discharge: 2014-02-01 | Disposition: A | Discharge status: Home / Self Care | Payer: Federal, State, Local not specified - PPO | Source: Ambulatory Visit | Attending: Radiation Oncology | Admitting: Radiation Oncology

## 2014-02-02 ENCOUNTER — Ambulatory Visit
Admission: RE | Admit: 2014-02-02 | Discharge: 2014-02-02 | Disposition: A | Discharge status: Home / Self Care | Payer: Federal, State, Local not specified - PPO | Source: Ambulatory Visit | Attending: Radiation Oncology | Admitting: Radiation Oncology

## 2014-02-05 ENCOUNTER — Ambulatory Visit
Admission: RE | Admit: 2014-02-05 | Discharge: 2014-02-05 | Disposition: A | Discharge status: Home / Self Care | Payer: Federal, State, Local not specified - PPO | Source: Ambulatory Visit | Attending: Radiation Oncology | Admitting: Radiation Oncology

## 2014-02-05 VITALS — BP 149/80 | HR 52 | Temp 97.9°F | Wt 108.0 lb

## 2014-02-05 DIAGNOSIS — C50212 Malignant neoplasm of upper-inner quadrant of left female breast: Secondary | ICD-10-CM

## 2014-02-05 NOTE — Progress Notes (Signed)
Patient for weekly assessment of radiation to left breast.Completed 14 of 25 treatments.Mild discoloration.Occasional shooting pain through breast which is normal.No increased fatigue.

## 2014-02-05 NOTE — Progress Notes (Signed)
Weekly Management Note:  Site: Left breast Current Dose:  2800  cGy Projected Dose: 5000  cGy followed by 5 fraction boost  Narrative: The patient is seen today for routine under treatment assessment. CBCT/MVCT images/port films were reviewed. The chart was reviewed.   No new complaints today. She does have shooting pains to her breast. She has Radioplex gel to use when necessary.  Physical Examination:  Filed Vitals:   02/05/14 1627  BP: 149/80  Pulse: 52  Temp: 97.9 F (36.6 C)  .  Weight: 108 lb (48.988 kg). There is mild hyperpigmentation the skin along her left breast. No areas of desquamation.  Impression: Tolerating radiation therapy well.  Plan: Continue radiation therapy as planned.

## 2014-02-06 ENCOUNTER — Ambulatory Visit
Admission: RE | Admit: 2014-02-06 | Discharge: 2014-02-06 | Disposition: A | Discharge status: Home / Self Care | Payer: Federal, State, Local not specified - PPO | Source: Ambulatory Visit | Attending: Radiation Oncology | Admitting: Radiation Oncology

## 2014-02-07 ENCOUNTER — Ambulatory Visit
Admission: RE | Admit: 2014-02-07 | Discharge: 2014-02-07 | Disposition: A | Discharge status: Home / Self Care | Payer: Federal, State, Local not specified - PPO | Source: Ambulatory Visit | Attending: Radiation Oncology | Admitting: Radiation Oncology

## 2014-02-08 ENCOUNTER — Ambulatory Visit
Admission: RE | Admit: 2014-02-08 | Discharge: 2014-02-08 | Disposition: A | Discharge status: Home / Self Care | Payer: Federal, State, Local not specified - PPO | Source: Ambulatory Visit | Attending: Radiation Oncology | Admitting: Radiation Oncology

## 2014-02-09 ENCOUNTER — Ambulatory Visit
Admission: RE | Admit: 2014-02-09 | Discharge: 2014-02-09 | Disposition: A | Discharge status: Home / Self Care | Payer: Federal, State, Local not specified - PPO | Source: Ambulatory Visit | Attending: Radiation Oncology | Admitting: Radiation Oncology

## 2014-02-12 ENCOUNTER — Ambulatory Visit
Admission: RE | Admit: 2014-02-12 | Discharge: 2014-02-12 | Disposition: A | Discharge status: Home / Self Care | Payer: Federal, State, Local not specified - PPO | Source: Ambulatory Visit | Attending: Radiation Oncology | Admitting: Radiation Oncology

## 2014-02-12 ENCOUNTER — Ambulatory Visit: Payer: Federal, State, Local not specified - PPO | Admitting: Radiation Oncology

## 2014-02-12 VITALS — BP 140/85 | HR 55 | Temp 98.2°F | Wt 107.1 lb

## 2014-02-12 DIAGNOSIS — C50412 Malignant neoplasm of upper-outer quadrant of left female breast: Secondary | ICD-10-CM

## 2014-02-12 DIAGNOSIS — C50212 Malignant neoplasm of upper-inner quadrant of left female breast: Secondary | ICD-10-CM

## 2014-02-12 MED ORDER — BIAFINE EX EMUL
Freq: Once | CUTANEOUS | Status: AC
  Administered 2014-02-12: 17:00:00 via TOPICAL

## 2014-02-12 NOTE — Progress Notes (Signed)
   Weekly Management Note:  outpatient Current Dose:  38 Gy  Projected Dose: 50 Gy initial  Narrative:  The patient presents for routine under treatment assessment.  CBCT/MVCT images/Port film x-rays were reviewed.  The chart was checked. Reports skin discoloration without peeling. Increased skin sensitivity. Denies fatigue   Physical Findings:  weight is 107 lb 1.6 oz (48.58 kg). Her temperature is 98.2 F (36.8 C). Her blood pressure is 140/85 and her pulse is 55.   hyperpigmentation of left breast, skin intact  Impression:  The patient is tolerating radiotherapy.  Plan:  Continue radiotherapy as planned.   Provided patient with Biafine and hydrogel pads for skin irritation. Sam Presnell firected patient upon use and she verbalized understanding.  ________________________________   Eppie Gibson, M.D.

## 2014-02-12 NOTE — Progress Notes (Signed)
Provided patient with Biafine and hydrogel pads as ordered by Dr. Isidore Moos. Directed patient upon use and she verbalized understanding. Charge sheet faxed and confirmation fax of delivery obtained.

## 2014-02-12 NOTE — Progress Notes (Signed)
Patient here for weekly assessment of left breast radiation.Completed 19 of 25  Treatments.Marked discoloration without peeling.Increased skin sensitivity.Denies fatigue.Contines to work but able to rest as needed.Continue application of radiaplex 2 to 3 times daily.

## 2014-02-13 ENCOUNTER — Ambulatory Visit
Admission: RE | Admit: 2014-02-13 | Discharge: 2014-02-13 | Disposition: A | Discharge status: Home / Self Care | Payer: Federal, State, Local not specified - PPO | Source: Ambulatory Visit | Attending: Radiation Oncology | Admitting: Radiation Oncology

## 2014-02-14 ENCOUNTER — Ambulatory Visit
Admission: RE | Admit: 2014-02-14 | Discharge: 2014-02-14 | Disposition: A | Discharge status: Home / Self Care | Payer: Federal, State, Local not specified - PPO | Source: Ambulatory Visit | Attending: Radiation Oncology | Admitting: Radiation Oncology

## 2014-02-15 ENCOUNTER — Ambulatory Visit
Admission: RE | Admit: 2014-02-15 | Discharge: 2014-02-15 | Disposition: A | Discharge status: Home / Self Care | Payer: Federal, State, Local not specified - PPO | Source: Ambulatory Visit | Attending: Radiation Oncology | Admitting: Radiation Oncology

## 2014-02-15 IMAGING — US US SOFT TISSUE HEAD/NECK
1 series · 13 of 25 positions shown · non-contrast
Comparison: Ultrasound of the thyroid of 01/15/2010

CLINICAL DATA: Increasing size of multinodular goiter

THYROID ULTRASOUND
TECHNIQUE: Ultrasound examination of the thyroid gland and adjacent
soft tissues was performed.

[Series 1: us soft tissue head/neck · 0.06mm/px · 13 of 56 slices shown]
[im 1/56]
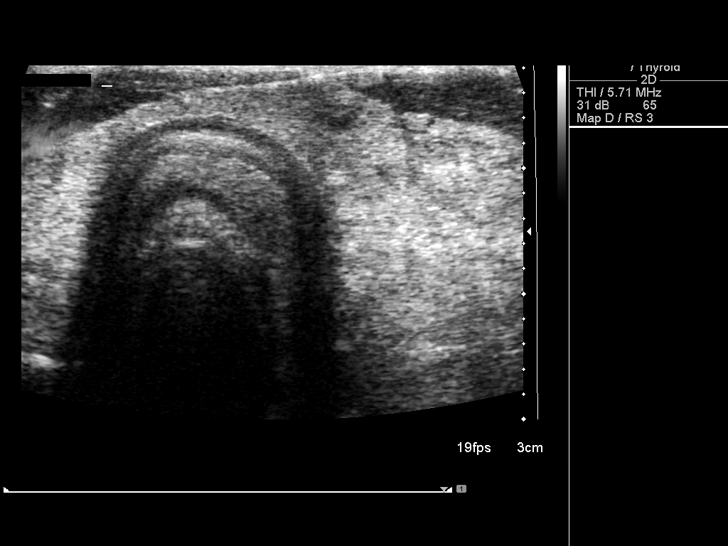
[im 5/56]
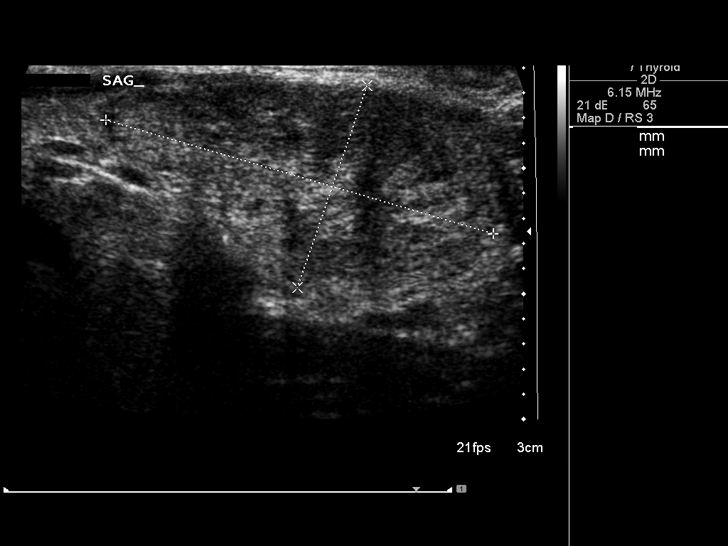
[im 10/56]
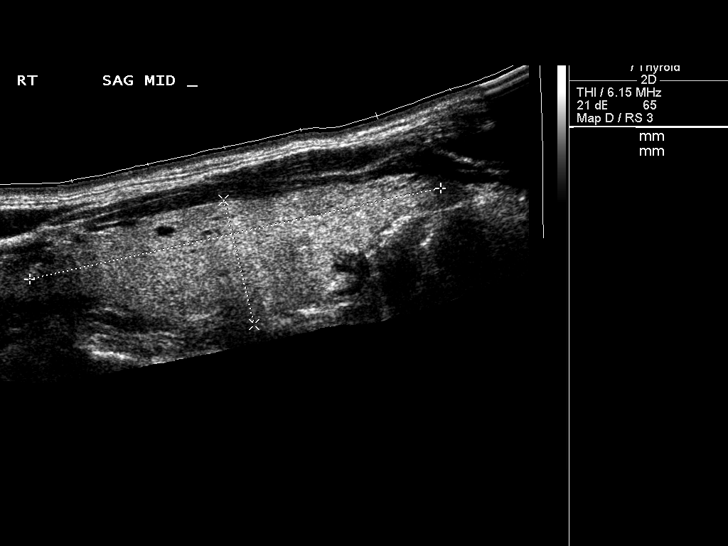
[im 14/56]
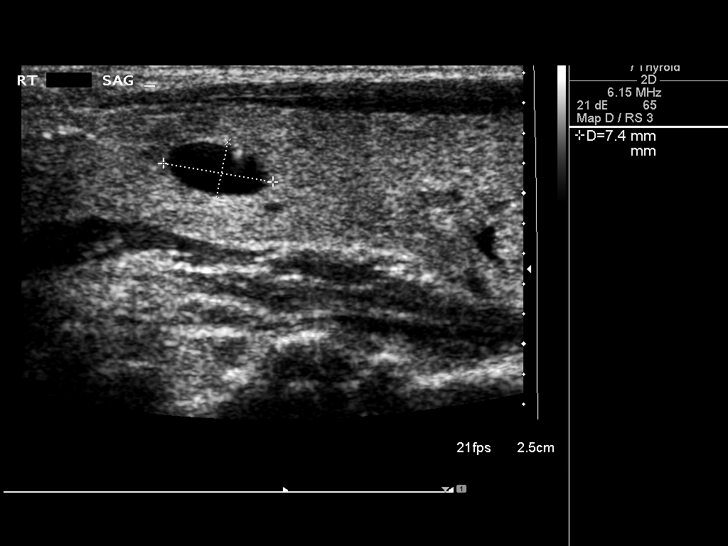
[im 19/56]
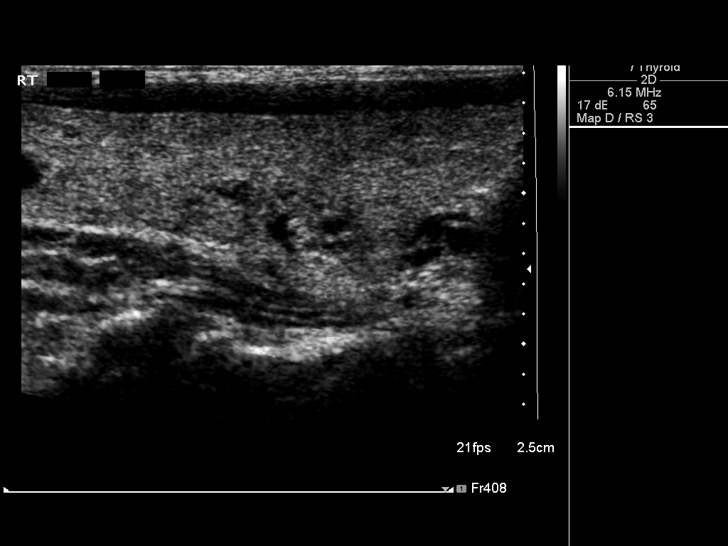
[im 23/56]
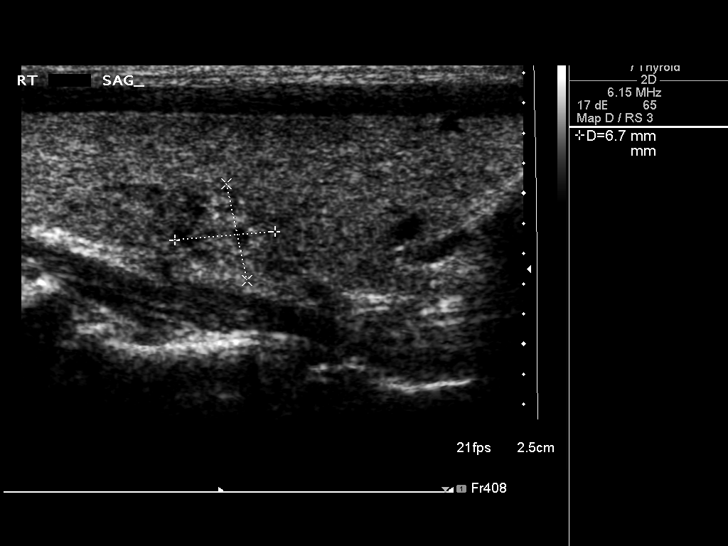
[im 28/56]
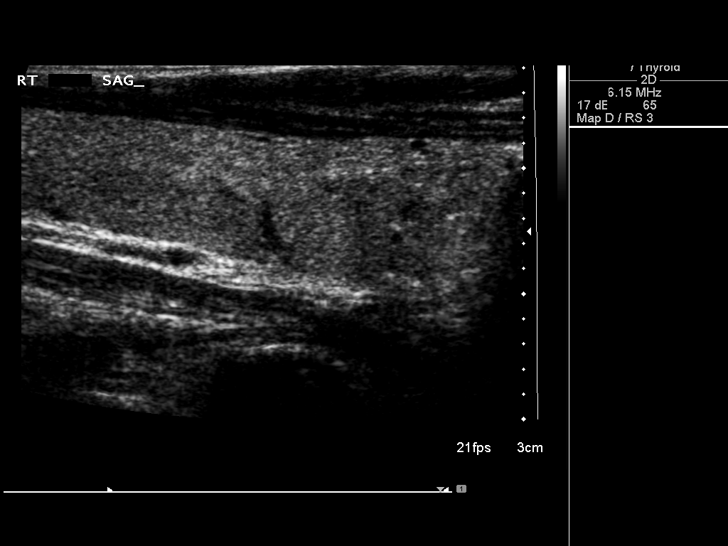
[im 33/56]
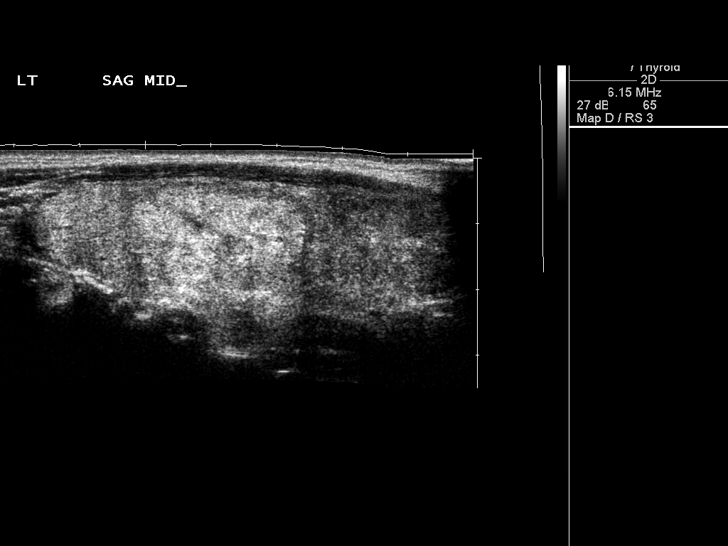
[im 37/56]
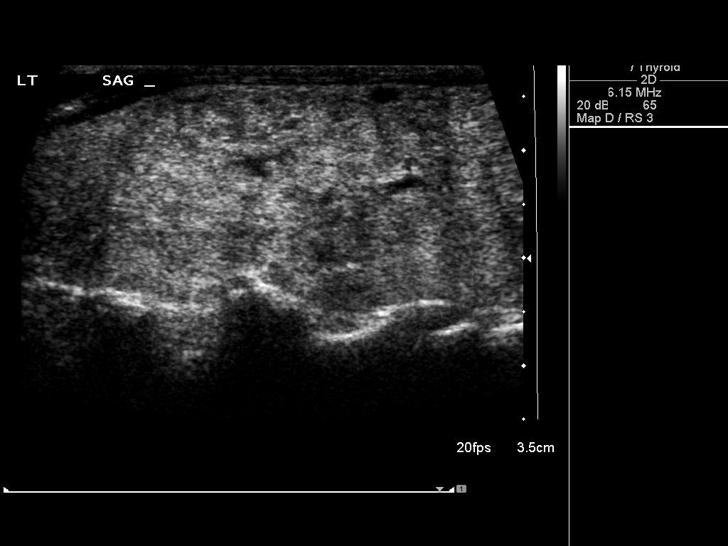
[im 42/56]
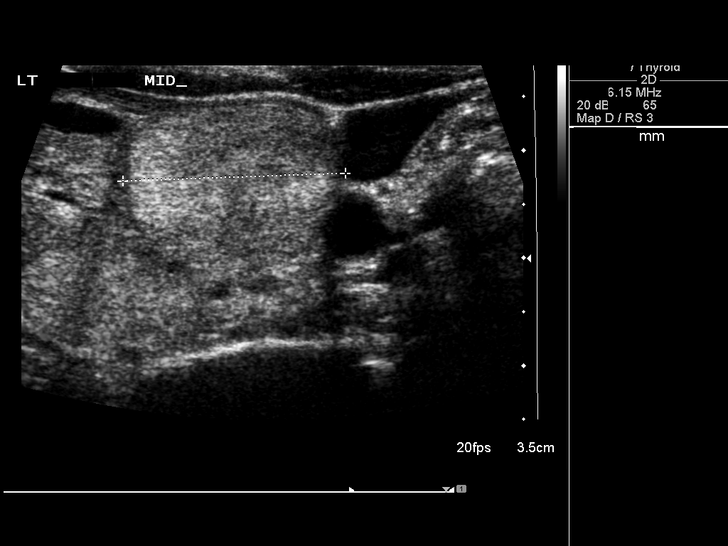
[im 46/56]
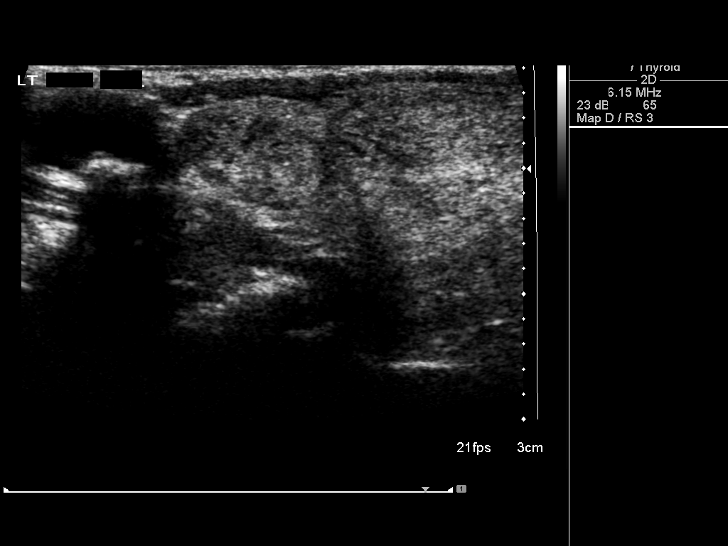
[im 51/56]
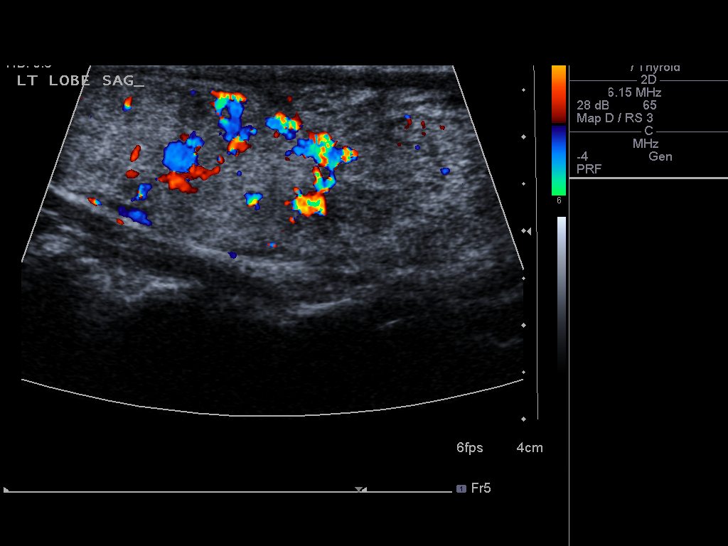
[im 56/56]
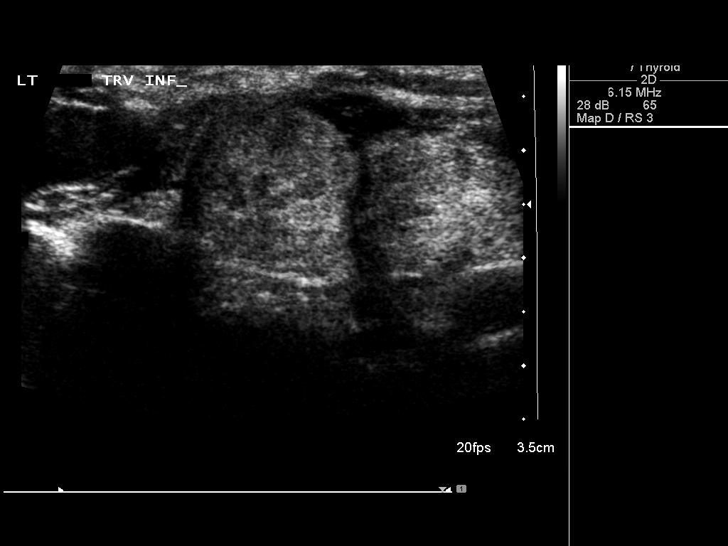

[13 of 25 positions shown; findings below may reference images not displayed]

FINDINGS: Right thyroid lobe:  5.4 x 1.6 x 1.8 cm.  (Previously 5.5 x 1.4 x
1.6 cm).
Left thyroid lobe:  6.9 x 2.2 x 3.7 cm.  (Previously 6.6 x 2.9 x
3.3 cm).
Isthmus:  5 mm in thickness.

Focal nodules:  The echogenicity of the thyroid gland is diffusely
inhomogeneous.  Multiple thyroid nodules are present primarily
throughout the left lobe and isthmus.  A nodule in the left isthmus
which is solid measures 3.2 x 1.7 x 2.1 cm compared to prior
measurements of 3.1 x 1.4 x 2.2 cm.  A solid nodule in the mid
upper left lobe measures 3.2 x 1.7 x 2.5 cm compared to prior
measurements of 3.0 x 1.7 x 2.9 cm.  A solid nodule in the mid left
lobe measures 2.7 x 2.0 x 2.1 cm, compared to prior measurements of
2.7 x 1.7 x 2.0 cm.  A solid nodule in the lower pole of the left
lobe measures 3.0 x 1.7 x 2.5 cm compared to prior measurements of
3.0 x 1.8 x 2.0 cm.  Nodules on the right measure no more than 7 mm
in diameter.

Lymphadenopathy:  None visualized.
IMPRESSION: Stable multinodular goiter with the dominant nodules occupying much
of the left lobe as well as the superior isthmus.

## 2014-02-16 ENCOUNTER — Ambulatory Visit: Payer: Federal, State, Local not specified - PPO

## 2014-02-16 ENCOUNTER — Telehealth: Payer: Self-pay | Admitting: *Deleted

## 2014-02-16 NOTE — Telephone Encounter (Signed)
Mailed after appt letter to pt. 

## 2014-02-19 ENCOUNTER — Encounter: Payer: Self-pay | Admitting: Radiation Oncology

## 2014-02-19 ENCOUNTER — Ambulatory Visit
Admission: RE | Admit: 2014-02-19 | Discharge: 2014-02-19 | Disposition: A | Discharge status: Home / Self Care | Payer: Federal, State, Local not specified - PPO | Source: Ambulatory Visit | Attending: Radiation Oncology | Admitting: Radiation Oncology

## 2014-02-19 ENCOUNTER — Ambulatory Visit: Payer: Federal, State, Local not specified - PPO | Admitting: Radiation Oncology

## 2014-02-19 ENCOUNTER — Ambulatory Visit
Admission: RE | Admit: 2014-02-19 | Payer: Federal, State, Local not specified - PPO | Source: Ambulatory Visit | Admitting: Radiation Oncology

## 2014-02-19 VITALS — BP 156/90 | HR 54 | Temp 98.0°F | Ht 63.0 in | Wt 107.7 lb

## 2014-02-19 DIAGNOSIS — C50212 Malignant neoplasm of upper-inner quadrant of left female breast: Secondary | ICD-10-CM

## 2014-02-19 NOTE — Progress Notes (Signed)
Complex simulation note: The patient underwent virtual complex electron beam simulation with electron Monte Carlo calculation for her left breast boost. She is set up en face. One custom block is constructed to conform the field with a 1.5 cm margin. I prescribing 1000 cGy in 5 sessions utilizing 6 MEV electrons. 75% of the prescribed dose is delivered to 100% of the target volume. A special port plan is requested.

## 2014-02-19 NOTE — Progress Notes (Signed)
Jessica [name] has had 22 fractions to her left breast.  She denies pain.  She reports fatigue from having company this weekend.  She reports that her skin is itchy around her nipple area.  The skin on her left breast has hyperpigmentation. She is using radiaplex gel and biafine cream.

## 2014-02-19 NOTE — Progress Notes (Signed)
Weekly Management Note:  Site: Left breast Current Dose:  4600  cGy Projected Dose: 5000  CGy followed by left breast boost  Narrative: The patient is seen today for routine under treatment assessment. CBCT/MVCT images/port films were reviewed. The chart was reviewed.   She does report pruritus particularly around the nipple. She uses Radioplex gel and Biafine cream.  Physical Examination:  Filed Vitals:   02/19/14 1931  BP: 156/90  Pulse: 54  Temp: 98 F (36.7 C)  .  Weight: 107 lb 11.2 oz (48.852 kg). There is hyperpigmentation the skin with patchy dry desquamation. No areas of moist desquamation.  Impression: Tolerating radiation therapy well.  Plan: Continue radiation therapy as planned.

## 2014-02-20 ENCOUNTER — Ambulatory Visit: Payer: Federal, State, Local not specified - PPO

## 2014-02-20 ENCOUNTER — Ambulatory Visit
Admission: RE | Admit: 2014-02-20 | Discharge: 2014-02-20 | Disposition: A | Discharge status: Home / Self Care | Payer: Federal, State, Local not specified - PPO | Source: Ambulatory Visit | Attending: Radiation Oncology | Admitting: Radiation Oncology

## 2014-02-21 ENCOUNTER — Ambulatory Visit: Payer: Federal, State, Local not specified - PPO

## 2014-02-21 ENCOUNTER — Ambulatory Visit
Admission: RE | Admit: 2014-02-21 | Discharge: 2014-02-21 | Disposition: A | Discharge status: Home / Self Care | Payer: Federal, State, Local not specified - PPO | Source: Ambulatory Visit | Attending: Radiation Oncology | Admitting: Radiation Oncology

## 2014-02-22 ENCOUNTER — Ambulatory Visit
Admission: RE | Admit: 2014-02-22 | Discharge: 2014-02-22 | Disposition: A | Discharge status: Home / Self Care | Payer: Federal, State, Local not specified - PPO | Source: Ambulatory Visit | Attending: Radiation Oncology | Admitting: Radiation Oncology

## 2014-02-22 ENCOUNTER — Ambulatory Visit: Payer: Federal, State, Local not specified - PPO

## 2014-02-23 ENCOUNTER — Ambulatory Visit
Admission: RE | Admit: 2014-02-23 | Discharge: 2014-02-23 | Disposition: A | Discharge status: Home / Self Care | Payer: Federal, State, Local not specified - PPO | Source: Ambulatory Visit | Attending: Radiation Oncology | Admitting: Radiation Oncology

## 2014-02-26 ENCOUNTER — Ambulatory Visit (HOSPITAL_BASED_OUTPATIENT_CLINIC_OR_DEPARTMENT_OTHER): Payer: Federal, State, Local not specified - PPO | Admitting: Oncology

## 2014-02-26 ENCOUNTER — Other Ambulatory Visit (HOSPITAL_BASED_OUTPATIENT_CLINIC_OR_DEPARTMENT_OTHER): Payer: Federal, State, Local not specified - PPO

## 2014-02-26 ENCOUNTER — Telehealth: Payer: Self-pay | Admitting: Oncology

## 2014-02-26 ENCOUNTER — Ambulatory Visit
Admission: RE | Admit: 2014-02-26 | Discharge: 2014-02-26 | Disposition: A | Discharge status: Home / Self Care | Payer: Federal, State, Local not specified - PPO | Source: Ambulatory Visit | Attending: Radiation Oncology | Admitting: Radiation Oncology

## 2014-02-26 VITALS — BP 161/76 | HR 52 | Temp 98.4°F | Resp 18 | Ht 63.0 in | Wt 107.8 lb

## 2014-02-26 DIAGNOSIS — C50212 Malignant neoplasm of upper-inner quadrant of left female breast: Secondary | ICD-10-CM

## 2014-02-26 DIAGNOSIS — C50619 Malignant neoplasm of axillary tail of unspecified female breast: Secondary | ICD-10-CM

## 2014-02-26 DIAGNOSIS — M810 Age-related osteoporosis without current pathological fracture: Secondary | ICD-10-CM

## 2014-02-26 DIAGNOSIS — C50412 Malignant neoplasm of upper-outer quadrant of left female breast: Secondary | ICD-10-CM

## 2014-02-26 DIAGNOSIS — F17200 Nicotine dependence, unspecified, uncomplicated: Secondary | ICD-10-CM

## 2014-02-26 DIAGNOSIS — Z170 Estrogen receptor positive status [ER+]: Secondary | ICD-10-CM

## 2014-02-26 DIAGNOSIS — I10 Essential (primary) hypertension: Secondary | ICD-10-CM

## 2014-02-26 DIAGNOSIS — C50919 Malignant neoplasm of unspecified site of unspecified female breast: Secondary | ICD-10-CM

## 2014-02-26 LAB — CBC WITH DIFFERENTIAL/PLATELET
BASO%: 0.7 % (ref 0.0–2.0)
BASOS ABS: 0 10*3/uL (ref 0.0–0.1)
EOS ABS: 0.2 10*3/uL (ref 0.0–0.5)
EOS%: 4.7 % (ref 0.0–7.0)
HEMATOCRIT: 40.5 % (ref 34.8–46.6)
HEMOGLOBIN: 13.3 g/dL (ref 11.6–15.9)
LYMPH%: 26.5 % (ref 14.0–49.7)
MCH: 29.3 pg (ref 25.1–34.0)
MCHC: 32.9 g/dL (ref 31.5–36.0)
MCV: 89.2 fL (ref 79.5–101.0)
MONO#: 0.4 10*3/uL (ref 0.1–0.9)
MONO%: 8.4 % (ref 0.0–14.0)
NEUT%: 59.7 % (ref 38.4–76.8)
NEUTROS ABS: 3.1 10*3/uL (ref 1.5–6.5)
Platelets: 286 10*3/uL (ref 145–400)
RBC: 4.54 10*6/uL (ref 3.70–5.45)
RDW: 13.4 % (ref 11.2–14.5)
WBC: 5.3 10*3/uL (ref 3.9–10.3)
lymph#: 1.4 10*3/uL (ref 0.9–3.3)

## 2014-02-26 LAB — COMPREHENSIVE METABOLIC PANEL (CC13)
ALT: 13 U/L (ref 0–55)
AST: 19 U/L (ref 5–34)
Albumin: 4.2 g/dL (ref 3.5–5.0)
Alkaline Phosphatase: 108 U/L (ref 40–150)
Anion Gap: 9 mEq/L (ref 3–11)
BUN: 11.1 mg/dL (ref 7.0–26.0)
CALCIUM: 9.8 mg/dL (ref 8.4–10.4)
CO2: 28 mEq/L (ref 22–29)
CREATININE: 0.8 mg/dL (ref 0.6–1.1)
Chloride: 104 mEq/L (ref 98–109)
GLUCOSE: 102 mg/dL (ref 70–140)
Potassium: 3.6 mEq/L (ref 3.5–5.1)
Sodium: 141 mEq/L (ref 136–145)
Total Bilirubin: 0.52 mg/dL (ref 0.20–1.20)
Total Protein: 7.8 g/dL (ref 6.4–8.3)

## 2014-02-26 NOTE — Progress Notes (Signed)
[place]  [phone] [phone]) [phone]     ID: Jessica [name] OB: 1951-02-04  MR#: [id number]  KGU#:[id number]  PCP: Gerrit Heck, MD GYN:  Freddie Apley SU: [name]; [name] OTHER MD: [name]; [name]  CHIEF COMPLAINT: "I have a mass that has been there for a while".  HISTORY OF PRESENT ILLNESS: "Jessica [name]" has noted a mass in her left breast for at least several months. She didn't think it was worth bringing to medical attention. When she went for routine gynecologic followup under [name] NP, this was palpated in the patient was set up for bilateral diagnostic mammography 10/23/2013 at [facility]. She was found to have breast density category C. A new irregular mass was noted in the left axillary tail. Ultrasound was obtained the same day and showed a 1.4 cm lobulated mass in the left breast at the 2:00 position. 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  On 11/13/2013 the patient underwent bilateral breast MRI. This showed a 1.3 cm irregular enhancing mass in the upper outer quadrant of the left breast (axillary tail). There were no other masses of concern in either breast and no abnormal appearing lymph nodes.  The patient's subsequent history is as detailed below.  INTERVAL HISTORY: Jessica [name] returns today for followup of her breast cancer accompanied by her granddaughter Dan Europe. Jessica [name] has been receiving radiation treatments and we'll complete in 2 days. She is tolerating them well, with some fatigue, but she has been able to continue to work full-time. She has also had some dry desquamation and hyperpigmentation, as  expected  REVIEW OF SYSTEMS: Jessica [name] is having mild hot flashes and night sweats. These were not very troublesome to her. She denies significant vaginal dryness issues. Just some sinus problems, which are seasonal. Her dentures don't fit well. She is anxious but not depressed. Overall a detailed review of systems today was noncontributory  PAST MEDICAL HISTORY: Past Medical History  Diagnosis Date  . Cancer     BREAST CANCER 1990'S  . Thyroid nodule     BENIGN-DR CORNELL  . Hypertension     DR. KINGSLEY  . Anxiety   . Depression   . Vitamin D deficiency   . Smoker   . Osteoporosis 2009    -2.6 SPINE  . Hyperlipidemia   . GERD (gastroesophageal reflux disease)   . Hot flashes   . Breast cancer 11/06/13    left    PAST SURGICAL HISTORY: Past Surgical History  Procedure Laterality Date  . Arm surgery      AT AGE 41-PLATE INSERTED-DUE TO FALL-lt  . Gynecologic cryosurgery  1980    CYTOTHERAPY OF CERVIX  . Breast surgery  1994    rt lump  . Tubal ligation    . Biopsy thyroid    . Breast lumpectomy with needle localization and axillary sentinel lymph node bx Left 12/11/2013    Procedure: BREAST LUMPECTOMY WITH NEEDLE LOCALIZATION AND AXILLARY SENTINEL LYMPH NODE BX;  Surgeon: [name], MD;  Location: [facility];  Service: General;  Laterality: Left;    FAMILY HISTORY Family History  Problem Relation Age of Onset  . Cancer Mother     COLON  . Diabetes Sister   .  Cancer Sister     COLON  . Heart disease Brother   . Cancer Father     throat  . Cancer Sister     stomach   the patient's father died in his mid 65s with cancer of the throat. The patient's mother died at the age of 19 with colon cancer. The patient had 4 brothers and 5 sisters. One sister was diagnosed with stomach cancer but the patient does not know at what age. One sister was diagnosed with colon cancer at age 64. There is no history of breast or ovarian cancer in the family to the  patient's knowledge.  GYNECOLOGIC HISTORY:  Menarche age 39, first live birth age 43, the patient is [place] P4. She thinks she went through menopause approximately age 89. She did not take hormone replacement.  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 Her husband [name] (goes by [name]) is retired from the [employer]. He works part-time as a Horticulturist, commercial. Daughter [name] lives in [place] where she works as a Product/process development scientist. Daughter [name] lives in [place] where she works as a Cabin crew. Daughter [name] works as a Marine scientist in [employer] here in [place]. Daughter [name] works as a Emergency planning/management officer in [place]. The patient has 7 grandchildren. She is a Psychologist, forensic.     ADVANCED DIRECTIVES: In place   HEALTH MAINTENANCE: History  Substance Use Topics  . Smoking status: Current Every Day Smoker -- 0.50 packs/day for 43 years  . Smokeless tobacco: Not on file     Comment: 01/29/14 decreased to 3 cigarettes daily  . Alcohol Use: Yes     Colonoscopy:2005  PAP:2014  Bone density:Remote  Lipid panel:  Allergies  Allergen Reactions  . Aspirin Nausea And Vomiting  . Effexor [Venlafaxine Hydrochloride] Other (See Comments)    Sees things   . Ivp Dye [Iodinated Diagnostic Agents]     Happened 1970s per pt-sob  . Penicillins     REACTION: hives/tongue swelled    Current Outpatient Prescriptions  Medication Sig Dispense Refill  . amLODipine (NORVASC) 5 MG tablet Take 5 mg by mouth daily.        . Biotin (PA BIOTIN) 1000 MCG tablet Take 1,000 mcg by mouth 3 (three) times daily.      Marland Kitchen emollient (BIAFINE) cream Apply topically 2 (two) times daily.      Marland Kitchen EPINEPHrine (EPI-PEN) 0.3 mg/0.3 mL DEVI Inject 0.3 mLs (0.3 mg total) into the muscle once.  2 Device  3  . hyaluronate sodium (RADIAPLEXRX) GEL Apply 1 application topically 2 (two) times daily.      . hydrochlorothiazide (HYDRODIURIL) 25 MG tablet Take 25 mg by  mouth at bedtime.      . Multiple Vitamins-Minerals (MULTIVITAMIN WITH MINERALS) tablet Take 1 tablet by mouth daily.      Marland Kitchen PARoxetine HCl (PAXIL PO) Take 10 mg by mouth See admin instructions. Pt takes only first 2 weeks of the month       No current facility-administered medications for this visit.    OBJECTIVE: Middle-aged [place] American woman in no acute distress Filed Vitals:   02/26/14 1533  BP: 161/76  Pulse: 52  Temp: 98.4 F (36.9 C)  Resp: 18     Body mass index is 19.1 kg/(m^2).    ECOG FS:1 - Symptomatic but completely ambulatory  Ocular: Sclerae unicteric, pupils round and equal Ear-nose-throat: Oropharynx clear moist Lymphatic: No cervical or supraclavicular adenopathy Lungs no  rales or rhonchi, good excursion bilaterally Heart regular rate and rhythm, no murmur appreciated Abd soft, nontender, positive bowel sounds MSK no focal spinal tenderness, no upper extremity edema Neuro: non-focal, well-oriented, friendly affect Breasts: The right breast is status post remote biopsy. There are no suspicious masses, skin changes, or nipple changes of concern. The right axilla is benign. The left breast is status post lumpectomy and is currently receiving radiation. There is dry desquamation only in the left axilla. There is significant hyperpigmentation over the radiation port. The left axilla is benign.   LAB RESULTS:  CMP     Component Value Date/Time   NA 140 11/22/2013 0836   K 3.7 11/22/2013 0836   CO2 28 11/22/2013 0836   GLUCOSE 134 11/22/2013 0836   BUN 10.3 11/22/2013 0836   CREATININE 0.8 11/22/2013 0836   CALCIUM 9.8 11/22/2013 0836   PROT 7.5 11/22/2013 0836   ALBUMIN 4.2 11/22/2013 0836   AST 18 11/22/2013 0836   ALT 12 11/22/2013 0836   ALKPHOS 90 11/22/2013 0836   BILITOT 1.06 11/22/2013 0836    I No results found for this basename: SPEP,  UPEP,   kappa and lambda light chains    Lab Results  Component Value Date   WBC 5.3 02/26/2014   NEUTROABS 3.1 02/26/2014   HGB  13.3 02/26/2014   HCT 40.5 02/26/2014   MCV 89.2 02/26/2014   PLT 286 02/26/2014      Chemistry      Component Value Date/Time   NA 140 11/22/2013 0836   K 3.7 11/22/2013 0836   CO2 28 11/22/2013 0836   BUN 10.3 11/22/2013 0836   CREATININE 0.8 11/22/2013 0836      Component Value Date/Time   CALCIUM 9.8 11/22/2013 0836   ALKPHOS 90 11/22/2013 0836   AST 18 11/22/2013 0836   ALT 12 11/22/2013 0836   BILITOT 1.06 11/22/2013 0836       No results found for this basename: LABCA2    No components found with this basename: LABCA125    No results found for this basename: INR,  in the last 168 hours  Urinalysis No results found for this basename: colorurine,  appearanceur,  labspec,  phurine,  glucoseu,  hgbur,  bilirubinur,  ketonesur,  proteinur,  urobilinogen,  nitrite,  leukocytesur    STUDIES: No results found.   ASSESSMENT: 63 y.o. Greensb[place] woman  (1) status post right upper inner quadrant lumpectomy 1994 for "breast cancer"; no data available; patient did not receive radiation, chemotherapy, or antiestrogen treatment  (2) status post left breast biopsy 11/06/2013 for a clinical T1c N0, stage IA invasive ductal carcinoma, grade 2, estrogen and progesterone receptor positive, HER-2 not amplified, with an MIB-1 of 10%  (3) genetics testing pending  (4) status post left lumpectomy and sentinel lymph node biopsy 12/11/2013 for apT1c pN0, stage IA invasive ductal carcinoma, grade 3, repeat HER-2 testing again negative  (5) Oncotype DX score of 16 predicts a 10 year risk of outside the breast recurrence of 10% if the patient's only systemic treatment is tamoxifen for 5 years. Is also predicts no benefit from chemotherapy.  (6) radiation therapy to be completed 02/28/2014  (7) anti-estrogens to follow radiation  (8) continuing cigarette abuse  PLAN: Jessica [name] will complete her radiation treatments in a couple of more days. She has done very well with them. Nevertheless I am asking her not  to start on the anti- estrogens until May first to give herself a little time  to recover.  We discussed the differences between anastrozole and tamoxifen. She has a good understanding of the possible toxicities, side effects and complications of both agents. In her case were going to at least start with anastrozole, because she continues to smoke, and I am concerned regarding clots with tamoxifen.  She has a bone density already scheduled before she returns to see me, and if there is significant osteopenia or osteoporosis we will consider zolendronate.  Jessica [name] has a good understanding of the overall plan. She agrees with it. She knows to call for any problems that may develop before next visit here.  Chauncey Cruel, MD   02/26/2014 4:05 PM

## 2014-02-26 NOTE — Telephone Encounter (Signed)
per pof to sch pm appt in July-cld & spoke w/pt to give appt time & date-pt understood

## 2014-02-26 NOTE — Progress Notes (Signed)
Weekly Management Note:  Site: Left breast Current Dose:  5600  cGy Projected Dose: 6000  cGy  Narrative: The patient is seen today for routine under treatment assessment. CBCT/MVCT images/port films were reviewed. The chart was reviewed.   She does have moderate fatigue. She was seen by Dr. Jana Hakim earlier today. She'll have a bone density later this month before she goes on anti-estrogen adjuvant therapy. She uses Radioplex gel.  Physical Examination: There were no vitals filed for this visit..  Weight:  . There is moderate hyperpigmentation the skin with dry desquamation along the axilla. No areas of moist desquamation.  Impression: Tolerating radiation therapy well. She'll finish her radiation therapy this Wednesday.  Plan: Continue radiation therapy as planned. One-month followup visit after completion of radiation therapy this Wednesday.

## 2014-02-27 ENCOUNTER — Ambulatory Visit
Admission: RE | Admit: 2014-02-27 | Discharge: 2014-02-27 | Disposition: A | Discharge status: Home / Self Care | Payer: Federal, State, Local not specified - PPO | Source: Ambulatory Visit | Attending: Radiation Oncology | Admitting: Radiation Oncology

## 2014-02-27 ENCOUNTER — Ambulatory Visit: Payer: Federal, State, Local not specified - PPO

## 2014-02-27 LAB — VITAMIN D 25 HYDROXY (VIT D DEFICIENCY, FRACTURES): VIT D 25 HYDROXY: 28 ng/mL — AB (ref 30–89)

## 2014-02-28 ENCOUNTER — Ambulatory Visit
Admission: RE | Admit: 2014-02-28 | Discharge: 2014-02-28 | Disposition: A | Discharge status: Home / Self Care | Payer: Federal, State, Local not specified - PPO | Source: Ambulatory Visit | Attending: Radiation Oncology | Admitting: Radiation Oncology

## 2014-02-28 ENCOUNTER — Encounter: Payer: Self-pay | Admitting: Radiation Oncology

## 2014-02-28 NOTE — Progress Notes (Signed)
[facility] End of Treatment Note  Name:Jessica [name]  Date: 02/28/2014 IYM:[id number] DOB:1951/02/22   Status:outpatient    CC: Gerrit Heck, MD  Dr. [name]  REFERRING PHYSICIAN: Dr. 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 She used Radioplex gel during her treatment.                          PLAN: Routine followup in one month. Patient instructed to call if questions or worsening complaints in interim.

## 2014-03-05 ENCOUNTER — Ambulatory Visit: Payer: Federal, State, Local not specified - PPO

## 2014-03-05 ENCOUNTER — Ambulatory Visit (INDEPENDENT_AMBULATORY_CARE_PROVIDER_SITE_OTHER): Payer: Federal, State, Local not specified - PPO

## 2014-03-05 DIAGNOSIS — M810 Age-related osteoporosis without current pathological fracture: Secondary | ICD-10-CM

## 2014-03-06 ENCOUNTER — Telehealth: Payer: Self-pay | Admitting: Gynecology

## 2014-03-06 ENCOUNTER — Encounter: Payer: Self-pay | Admitting: Gynecology

## 2014-03-06 NOTE — Telephone Encounter (Signed)
Okay to route a copy of the density to her oncologist. Does not change so the recommendation to consider treatment.

## 2014-03-06 NOTE — Telephone Encounter (Signed)
Tell patient that her bone density shows osteoporosis.  Recommend office visit to discuss treatment options. 

## 2014-03-06 NOTE — Telephone Encounter (Signed)
Pt said is still seeing oncologist and was just released from radiation last week. She asked if her oncologist should be aware of her new diagnosis first before starting any medication? Pt is not declining OV, per patient " she would like every physician to be on same page" please advise

## 2014-03-07 ENCOUNTER — Other Ambulatory Visit: Payer: Self-pay

## 2014-03-07 NOTE — Telephone Encounter (Signed)
Pt will speak with oncologist to review report, pt will call back to schedule.

## 2014-03-21 ENCOUNTER — Encounter: Payer: Self-pay | Admitting: *Deleted

## 2014-03-28 ENCOUNTER — Ambulatory Visit
Admission: RE | Admit: 2014-03-28 | Discharge: 2014-03-28 | Disposition: A | Discharge status: Home / Self Care | Payer: Federal, State, Local not specified - PPO | Source: Ambulatory Visit | Attending: Radiation Oncology | Admitting: Radiation Oncology

## 2014-03-28 ENCOUNTER — Other Ambulatory Visit: Payer: Self-pay | Admitting: Oncology

## 2014-03-28 VITALS — BP 154/73 | HR 55 | Temp 98.0°F | Resp 20 | Wt 105.7 lb

## 2014-03-28 DIAGNOSIS — C50212 Malignant neoplasm of upper-inner quadrant of left female breast: Secondary | ICD-10-CM

## 2014-03-28 MED ORDER — TAMOXIFEN CITRATE 20 MG PO TABS: 20.0000 mg | ORAL_TABLET | Freq: Every day | ORAL | Status: DC

## 2014-03-28 NOTE — Progress Notes (Signed)
Pt denies pain, fatigue, loss of appetite. She states she did have fatigue at the end of her radiation treatments and took intermittent days off at work. Pt has not begun anti estrogen medication. She states Dr Jana Hakim was waiting for her bone density results. Pt had bone density done in April, states "dr who did bone density test wanted to start me on medication, but I wanted him to discuss that w/Dr Magrinat first." pt  States she has not heard back from dr who did bone density. Her next appointment w/Dr Magrinat is 06/04/14. Informed pt that this RN will send note to Dr Magrinat's RN re: does she need to see Dr Jana Hakim sooner?

## 2014-03-28 NOTE — Progress Notes (Signed)
Followup note:  Jessica [name] visits today approximately 1 month following completion of radiation therapy following conservative surgery in the management of her T1 N0 invasive ductal/DCIS of left breast. She is without complaints today. She is pleased with her cosmesis. She tells me that Dr. Jana Hakim called in a prescription for medication  related to her bone density or antiestrogen therapy (question tamoxifen) which she has not yet picked up. Her bone density apparently showed osteoporosis. She will see Dr. Jana Hakim on July 20.  Physical examination: Alert and oriented. Filed Vitals:   03/28/14 1001  BP: 154/73  Pulse: 55  Temp: 98 F (36.7 C)  Resp: 20   Nodes: Without palpable cervical, supraclavicular, or axillary lymphadenopathy. Chest: Lungs clear. Breasts: There is residual hyperpigmentation the skin along the left breast. Cosmesis is excellent. No masses are appreciated. Right breast without masses or lesions. Abdomen without hepatomegaly. Extremities: Without edema.  Impression: Satisfactory progress. She will visit Dr. Jana Hakim for discussion of antiestrogen therapy in July. I told she can return to [facility] for bilateral mammography this December. I've not scheduled the patient for a formal followup visit, but I would more than happy see her in the future should the need arise.

## 2014-04-02 ENCOUNTER — Telehealth: Payer: Self-pay | Admitting: *Deleted

## 2014-04-02 NOTE — Telephone Encounter (Signed)
Called pt to inform her that we have Dexa Scan report & that she should start tamoxifen but need to clarify with Dr Jana Hakim based on his last OV note, may want to start arimidex.  Informed that we would call her back if she needs to do something different.

## 2014-04-12 ENCOUNTER — Other Ambulatory Visit: Payer: Self-pay | Admitting: *Deleted

## 2014-04-12 DIAGNOSIS — F17200 Nicotine dependence, unspecified, uncomplicated: Secondary | ICD-10-CM

## 2014-04-12 DIAGNOSIS — C50419 Malignant neoplasm of upper-outer quadrant of unspecified female breast: Secondary | ICD-10-CM

## 2014-04-12 DIAGNOSIS — C50219 Malignant neoplasm of upper-inner quadrant of unspecified female breast: Secondary | ICD-10-CM

## 2014-04-12 DIAGNOSIS — M948X9 Other specified disorders of cartilage, unspecified sites: Secondary | ICD-10-CM

## 2014-04-12 MED ORDER — EXEMESTANE 25 MG PO TABS
25.0000 mg | ORAL_TABLET | Freq: Every day | ORAL | Status: DC
Start: 2014-04-12 — End: 2014-04-12

## 2014-04-12 NOTE — Telephone Encounter (Signed)
Reached pt today after several tries & messages left to call back.  She reports that she has tried to call back but was unable to get through.  Discussed exemestane with pt & informed that Dr. Jana Hakim would like her to start this drug & suggested if it is too expensive to call us.  Informed not to take the tamoxifen or the Arimidex.  She had picked up either of these scripts yet & they were cancelled in the computer.  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  Exemestane script e-scribed to pharmacy.

## 2014-04-30 ENCOUNTER — Encounter (INDEPENDENT_AMBULATORY_CARE_PROVIDER_SITE_OTHER): Payer: Self-pay | Admitting: Surgery

## 2014-05-28 ENCOUNTER — Telehealth: Payer: Self-pay | Admitting: Oncology

## 2014-05-28 ENCOUNTER — Other Ambulatory Visit: Payer: Federal, State, Local not specified - PPO

## 2014-05-28 NOTE — Telephone Encounter (Signed)
pt wanted to r/s lab until 7/14-adv pt of new time

## 2014-05-29 ENCOUNTER — Telehealth: Payer: Self-pay | Admitting: Oncology

## 2014-05-29 ENCOUNTER — Ambulatory Visit: Payer: Federal, State, Local not specified - PPO

## 2014-05-29 ENCOUNTER — Other Ambulatory Visit (HOSPITAL_BASED_OUTPATIENT_CLINIC_OR_DEPARTMENT_OTHER): Payer: Federal, State, Local not specified - PPO

## 2014-05-29 ENCOUNTER — Other Ambulatory Visit: Payer: Self-pay | Admitting: *Deleted

## 2014-05-29 DIAGNOSIS — C50212 Malignant neoplasm of upper-inner quadrant of left female breast: Secondary | ICD-10-CM

## 2014-05-29 DIAGNOSIS — E559 Vitamin D deficiency, unspecified: Secondary | ICD-10-CM

## 2014-05-29 DIAGNOSIS — M810 Age-related osteoporosis without current pathological fracture: Secondary | ICD-10-CM

## 2014-05-29 DIAGNOSIS — C50619 Malignant neoplasm of axillary tail of unspecified female breast: Secondary | ICD-10-CM

## 2014-05-29 LAB — COMPREHENSIVE METABOLIC PANEL (CC13)
ALBUMIN: 4.3 g/dL (ref 3.5–5.0)
ALT: 18 U/L (ref 0–55)
ANION GAP: 8 meq/L (ref 3–11)
AST: 28 U/L (ref 5–34)
Alkaline Phosphatase: 97 U/L (ref 40–150)
BUN: 10.8 mg/dL (ref 7.0–26.0)
CALCIUM: 9.8 mg/dL (ref 8.4–10.4)
CO2: 27 meq/L (ref 22–29)
Chloride: 106 mEq/L (ref 98–109)
Creatinine: 0.9 mg/dL (ref 0.6–1.1)
GLUCOSE: 90 mg/dL (ref 70–140)
POTASSIUM: 3.9 meq/L (ref 3.5–5.1)
Sodium: 141 mEq/L (ref 136–145)
TOTAL PROTEIN: 8 g/dL (ref 6.4–8.3)
Total Bilirubin: 0.83 mg/dL (ref 0.20–1.20)

## 2014-05-29 LAB — CBC WITH DIFFERENTIAL/PLATELET
BASO%: 1.3 % (ref 0.0–2.0)
Basophils Absolute: 0.1 10*3/uL (ref 0.0–0.1)
EOS ABS: 0.2 10*3/uL (ref 0.0–0.5)
EOS%: 4.3 % (ref 0.0–7.0)
HEMATOCRIT: 41.3 % (ref 34.8–46.6)
HGB: 13.4 g/dL (ref 11.6–15.9)
LYMPH%: 24 % (ref 14.0–49.7)
MCH: 29.1 pg (ref 25.1–34.0)
MCHC: 32.4 g/dL (ref 31.5–36.0)
MCV: 89.6 fL (ref 79.5–101.0)
MONO#: 0.4 10*3/uL (ref 0.1–0.9)
MONO%: 7.3 % (ref 0.0–14.0)
NEUT#: 3.6 10*3/uL (ref 1.5–6.5)
NEUT%: 63.1 % (ref 38.4–76.8)
PLATELETS: 279 10*3/uL (ref 145–400)
RBC: 4.61 10*6/uL (ref 3.70–5.45)
RDW: 13.1 % (ref 11.2–14.5)
WBC: 5.8 10*3/uL (ref 3.9–10.3)
lymph#: 1.4 10*3/uL (ref 0.9–3.3)

## 2014-05-29 NOTE — Telephone Encounter (Signed)
pt r/s appt from 7/13-appt not on sch-added sent to registration

## 2014-05-30 LAB — VITAMIN D 25 HYDROXY (VIT D DEFICIENCY, FRACTURES): VIT D 25 HYDROXY: 47 ng/mL (ref 30–89)

## 2014-06-04 ENCOUNTER — Other Ambulatory Visit: Payer: Federal, State, Local not specified - PPO

## 2014-06-04 ENCOUNTER — Telehealth: Payer: Self-pay | Admitting: Oncology

## 2014-06-04 ENCOUNTER — Encounter: Payer: Self-pay | Admitting: Oncology

## 2014-06-04 ENCOUNTER — Ambulatory Visit (HOSPITAL_BASED_OUTPATIENT_CLINIC_OR_DEPARTMENT_OTHER): Payer: Federal, State, Local not specified - PPO | Admitting: Oncology

## 2014-06-04 VITALS — BP 152/86 | HR 51 | Temp 97.8°F | Resp 20 | Ht 63.0 in | Wt 105.3 lb

## 2014-06-04 DIAGNOSIS — Z853 Personal history of malignant neoplasm of breast: Secondary | ICD-10-CM

## 2014-06-04 DIAGNOSIS — C50212 Malignant neoplasm of upper-inner quadrant of left female breast: Secondary | ICD-10-CM

## 2014-06-04 DIAGNOSIS — C50412 Malignant neoplasm of upper-outer quadrant of left female breast: Secondary | ICD-10-CM

## 2014-06-04 DIAGNOSIS — M810 Age-related osteoporosis without current pathological fracture: Secondary | ICD-10-CM

## 2014-06-04 DIAGNOSIS — Z170 Estrogen receptor positive status [ER+]: Secondary | ICD-10-CM

## 2014-06-04 DIAGNOSIS — C50219 Malignant neoplasm of upper-inner quadrant of unspecified female breast: Secondary | ICD-10-CM

## 2014-06-04 MED ORDER — ANASTROZOLE 1 MG PO TABS: 1.0000 mg | ORAL_TABLET | Freq: Every day | ORAL | Status: DC

## 2014-06-04 NOTE — Progress Notes (Signed)
[place]  [phone] Fax:(336) [phone]     ID: Jessica [name] OB: 29-Aug-1951  MR#: [id number]  MWN#:[id number]  PCP: Gerrit Heck, MD GYN:  Freddie Apley SU: [name]; [name] OTHER MD: [name]; [name]  CHIEF COMPLAINT: "I have a mass that has been there for a while".  HISTORY OF PRESENT ILLNESS: "Jessica [name]" has noted a mass in her left breast for at least several months. She didn't think it was worth bringing to medical attention. When she went for routine gynecologic followup under [name] NP, this was palpated in the patient was set up for bilateral diagnostic mammography 10/23/2013 at [facility]. She was found to have breast density category C. A new irregular mass was noted in the left axillary tail. Ultrasound was obtained the same day and showed a 1.4 cm lobulated mass in the left breast at the 2:00 position. 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  On 11/13/2013 the patient underwent bilateral breast MRI. This showed a 1.3 cm irregular enhancing mass in the upper outer quadrant of the left breast (axillary tail). There were no other masses of concern in either breast and no abnormal appearing lymph nodes.  The patient's subsequent history is as detailed below.  INTERVAL HISTORY: Jessica [name] returns today for followup of her breast cancer. She completed radiation in April and was supposed to have started anastrozole at that time. However when she went to her pharmacy they wanted to charger a $50 co-pay for one month supply. She thought this was too much and she decided she would just wait and see if we could find her a little  less expensive way for her to obtain it  REVIEW OF SYSTEMS: Jessica [name] denies any unusual symptoms. She does have a poor appetite but has not lost any weight. There has not been any taste alteration, nausea, or vomiting. She can be tired at times but she is very active doing walking, dancing, and biking. She just had a bone density showing osteoporosis. This is a big concern to her. Otherwise a detailed review of systems today was noncontributory  PAST MEDICAL HISTORY: Past Medical History  Diagnosis Date  . Cancer     BREAST CANCER 1990'S  . Thyroid nodule     BENIGN-DR CORNELL  . Hypertension     DR. KINGSLEY  . Anxiety   . Depression   . Vitamin D deficiency   . Smoker   . Osteoporosis 02/2014    T score -3.1 AP spine  . Hyperlipidemia   . GERD (gastroesophageal reflux disease)   . Hot flashes   . Breast cancer 11/06/13    left  . Hx of radiation therapy 01/17/14- 02/28/14    left breast 5000 cGy in 25 sessions, left breast boost 1000 cGy in 5 sessions    PAST SURGICAL HISTORY: Past Surgical History  Procedure Laterality Date  . Arm surgery      AT AGE 28-PLATE INSERTED-DUE TO FALL-lt  . Gynecologic cryosurgery  1980    CYTOTHERAPY OF CERVIX  . Breast surgery  1994    rt lump  . Tubal ligation    . Biopsy thyroid    . Breast lumpectomy with needle localization and axillary  sentinel lymph node bx Left 12/11/2013    Procedure: BREAST LUMPECTOMY WITH NEEDLE LOCALIZATION AND AXILLARY SENTINEL LYMPH NODE BX;  Surgeon: [name], MD;  Location: [facility];  Service: General;  Laterality: Left;    FAMILY HISTORY Family History  Problem Relation Age of Onset  . Cancer Mother     COLON  . Diabetes Sister   . Cancer Sister     COLON  . Heart disease Brother   . Cancer Father     throat  . Cancer Sister     stomach   the patient's father died in his mid 74s with cancer of the throat. The patient's mother died at the age of 91 with colon cancer. The  patient had 4 brothers and 5 sisters. One sister was diagnosed with stomach cancer but the patient does not know at what age. One sister was diagnosed with colon cancer at age 33. There is no history of breast or ovarian cancer in the family to the patient's knowledge.  GYNECOLOGIC HISTORY:  Menarche age 47, first live birth age 7, the patient is Trezevant P4. She thinks she went through menopause approximately age 20. She did not take hormone replacement.  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 Her husband [name] (goes by [name]) is retired from the [employer]. He works part-time as a Horticulturist, commercial. Daughter [name] lives in [place] where she works as a Product/process development scientist. Daughter [name] lives in [place] where she works as a Cabin crew. Daughter [name] works as a Marine scientist in [employer] here in [place]. Daughter [name] works as a Emergency planning/management officer in [place]. The patient has 7 grandchildren. She is a Psychologist, forensic.     ADVANCED DIRECTIVES: In place   HEALTH MAINTENANCE: History  Substance Use Topics  . Smoking status: Current Every Day Smoker -- 0.50 packs/day for 43 years  . Smokeless tobacco: Not on file     Comment: 01/29/14 decreased to 3 cigarettes daily  . Alcohol Use: Yes     Colonoscopy:2005  PAP:2014  Bone density:Remote  Lipid panel:  Allergies  Allergen Reactions  . Aspirin Nausea And Vomiting  . Effexor [Venlafaxine Hydrochloride] Other (See Comments)    Sees things   . Ivp Dye [Iodinated Diagnostic Agents]     Happened 1970s per pt-sob  . Penicillins     REACTION: hives/tongue swelled    Current Outpatient Prescriptions  Medication Sig Dispense Refill  . amLODipine (NORVASC) 5 MG tablet Take 5 mg by mouth daily.        . Biotin (PA BIOTIN) 1000 MCG tablet Take 1,000 mcg by mouth 3 (three) times daily.      Marland Kitchen emollient (BIAFINE) cream Apply topically 2 (two) times daily.      Marland Kitchen EPINEPHrine (EPI-PEN)  0.3 mg/0.3 mL DEVI Inject 0.3 mLs (0.3 mg total) into the muscle once.  2 Device  3  . hyaluronate sodium (RADIAPLEXRX) GEL Apply 1 application topically 2 (two) times daily.      . hydrochlorothiazide (HYDRODIURIL) 25 MG tablet Take 25 mg by mouth at bedtime.      . Multiple Vitamins-Minerals (MULTIVITAMIN WITH MINERALS) tablet Take 1 tablet by mouth daily.      Marland Kitchen PARoxetine HCl (PAXIL PO) Take 10 mg by mouth See admin instructions. Pt takes only first 2 weeks of the month       No current facility-administered medications for this visit.    OBJECTIVE: Middle-aged African  American woman who appears stated age 62 Vitals:   06/04/14 1344  BP: 152/86  Pulse: 51  Temp: 97.8 F (36.6 C)  Resp: 20     Body mass index is 18.66 kg/(m^2).    ECOG FS:0 - Asymptomatic  Sclerae unicteric, pupils equal and reactive Oropharynx clear and moist No cervical or supraclavicular adenopathy Lungs no rales or rhonchi Heart regular rate and rhythm Abd soft, nontender, positive bowel sounds MSK no focal spinal tenderness, no upper extremity lymphedema Neuro: nonfocal, well oriented, positive affect Breasts: The right breast is status post remote biopsy. There are no suspicious findings. The right axilla is benign. The left breast is status post lumpectomy and radiation. There is hyperpigmentation over the radiation port. The left breast is slightly larger than the right. The left axilla is benign.  LAB RESULTS:  CMP     Component Value Date/Time   NA 141 05/29/2014 1234   K 3.9 05/29/2014 1234   CO2 27 05/29/2014 1234   GLUCOSE 90 05/29/2014 1234   BUN 10.8 05/29/2014 1234   CREATININE 0.9 05/29/2014 1234   CALCIUM 9.8 05/29/2014 1234   PROT 8.0 05/29/2014 1234   ALBUMIN 4.3 05/29/2014 1234   AST 28 05/29/2014 1234   ALT 18 05/29/2014 1234   ALKPHOS 97 05/29/2014 1234   BILITOT 0.83 05/29/2014 1234    I No results found for this basename: SPEP,  UPEP,   kappa and lambda light chains    Lab  Results  Component Value Date   WBC 5.8 05/29/2014   NEUTROABS 3.6 05/29/2014   HGB 13.4 05/29/2014   HCT 41.3 05/29/2014   MCV 89.6 05/29/2014   PLT 279 05/29/2014      Chemistry      Component Value Date/Time   NA 141 05/29/2014 1234   K 3.9 05/29/2014 1234   CO2 27 05/29/2014 1234   BUN 10.8 05/29/2014 1234   CREATININE 0.9 05/29/2014 1234      Component Value Date/Time   CALCIUM 9.8 05/29/2014 1234   ALKPHOS 97 05/29/2014 1234   AST 28 05/29/2014 1234   ALT 18 05/29/2014 1234   BILITOT 0.83 05/29/2014 1234       No results found for this basename: LABCA2    No components found with this basename: LABCA125    No results found for this basename: INR,  in the last 168 hours  Urinalysis No results found for this basename: colorurine,  appearanceur,  labspec,  phurine,  glucoseu,  hgbur,  bilirubinur,  ketonesur,  proteinur,  urobilinogen,  nitrite,  leukocytesur    STUDIES: DEXA scan 03/05/2014 at [facility] showed osteoporosis, with a lowest T score of -3.0  ASSESSMENT: 63 y.o. Greensb[place] woman  (1) status post right upper inner quadrant lumpectomy 1994 for "breast cancer"; no data available; patient did not receive radiation, chemotherapy, or antiestrogen treatment  (2) status post left breast biopsy 11/06/2013 for a clinical T1c N0, stage IA invasive ductal carcinoma, grade 2, estrogen and progesterone receptor positive, HER-2 not amplified, with an MIB-1 of 10%  (3) genetics testing pending  (4) status post left lumpectomy and sentinel lymph node biopsy 12/11/2013 for apT1c pN0, stage IA invasive ductal carcinoma, grade 3, repeat HER-2 testing again negative  (5) Oncotype DX score of 16 predicts a 10 year risk of outside the breast recurrence of 10% if the patient's only systemic treatment is tamoxifen for 5 years. Is also predicts no benefit from chemotherapy.  (6) radiation therapy completed  02/28/2014  (7) starting anastrozole July 2015  (8)  continuing cigarette abuse  PLAN: We discussed again the difference between anastrozole and tamoxifen and the patient has a good understanding of the difference between them in terms of side effects, complications and toxicities. She is very much against going on tamoxifen because of the risk of blood clots. The problem of course is that anastrozole can worsen her osteoporosis problem.  She is going to be meeting with Dr. Phineas Real did discuss osteoporosis treatment and I reassured her that if she goes on a bisphosphonate, whether it is Reclast or one of the oral agents, the concern regarding osteoporosis worsening because of anastrozole would be taken care of. Accordingly she is ready to start anastrozole at this point. She is going to call me if she has any unusual or troublesome side effects. Otherwise she will return to see me again in 3 months. If she is tolerating her anastrozole then I will start seeing her on an every 6 month basis.  She has a good understanding of the overall plan. She agrees with it. She knows the goal of treatment in her cases cure. She will call with any problems that may develop before her next visit here. Chauncey Cruel, MD   06/04/2014 1:54 PM

## 2014-06-04 NOTE — Telephone Encounter (Signed)
gv and printed appt sched and avs for pt for OCT. °

## 2014-06-05 NOTE — Addendum Note (Signed)
Addended by: Amelia Jo I on: 06/05/2014 01:10 PM   Modules accepted: Orders, Medications

## 2014-06-08 ENCOUNTER — Encounter: Payer: Self-pay | Admitting: *Deleted

## 2014-06-08 ENCOUNTER — Encounter (INDEPENDENT_AMBULATORY_CARE_PROVIDER_SITE_OTHER): Payer: Self-pay | Admitting: Surgery

## 2014-06-08 ENCOUNTER — Ambulatory Visit (INDEPENDENT_AMBULATORY_CARE_PROVIDER_SITE_OTHER): Payer: Federal, State, Local not specified - PPO | Admitting: Surgery

## 2014-06-08 ENCOUNTER — Other Ambulatory Visit (INDEPENDENT_AMBULATORY_CARE_PROVIDER_SITE_OTHER): Payer: Self-pay | Admitting: Surgery

## 2014-06-08 VITALS — BP 150/60 | HR 72 | Temp 97.0°F | Resp 18 | Ht 63.0 in | Wt 106.0 lb

## 2014-06-08 DIAGNOSIS — E049 Nontoxic goiter, unspecified: Secondary | ICD-10-CM

## 2014-06-08 MED ORDER — LEVOTHYROXINE SODIUM 100 MCG PO TABS: 100.0000 ug | ORAL_TABLET | Freq: Every day | ORAL | Status: DC

## 2014-06-08 NOTE — Patient Instructions (Signed)
Will restart synthroid since it seems to have helped the gland soften and shrink.  Make sure you follow up with your primary care. Will  SCHEDULE THE U/S TO FOLLOW UP GOITER.

## 2014-06-08 NOTE — Progress Notes (Signed)
Patient ID: Jessica [name], female   DOB: 19-Jul-1951, 63 y.o.   MRN: [id number]  Chief Complaint  Patient presents with  . Follow-up    yearly follow up thyroid nodules    HPI Jessica [name] is a 63 y.o. female.  Patient presents in followup for thyroid goiter. I saw in 2007 she underwent workup with ultrasound and fine-needle aspiration which have benign findings. We had a long talk back in about surgery versus observation and she chose observation. She has had 2 ultrasounds since that time and these have remained stable over 6 years. She is interested in trying Synthroid to see if this will treat her goiter. She continues to have no interest in surgical treatment of this problem. Otherwise, she feels well. Her voice is normal. She seems quite frequently and has no problems with that. Her weight is remained stable. She is not excessively anxious. She does have a small amount of pain around the anterior portion of her neck when she looks upward. She was treated for stage 1 breast cancer this year and is doing well.  Felt better on synthroid but script ran out and she never refilled it.   HPI  Past Medical History  Diagnosis Date  . Cancer     BREAST CANCER 1990'S  . Thyroid nodule     BENIGN-DR CORNELL  . Hypertension     DR. KINGSLEY  . Anxiety   . Depression   . Vitamin D deficiency   . Smoker   . Osteoporosis 02/2014    T score -3.1 AP spine  . Hyperlipidemia   . GERD (gastroesophageal reflux disease)   . Hot flashes   . Breast cancer 11/06/13    left  . Hx of radiation therapy 01/17/14- 02/28/14    left breast 5000 cGy in 25 sessions, left breast boost 1000 cGy in 5 sessions    Past Surgical History  Procedure Laterality Date  . Arm surgery      AT AGE 50-PLATE INSERTED-DUE TO FALL-lt  . Gynecologic cryosurgery  1980    CYTOTHERAPY OF CERVIX  . Breast surgery  1994    rt lump  . Tubal ligation    . Biopsy thyroid    . Breast lumpectomy with needle localization and  axillary sentinel lymph node bx Left 12/11/2013    Procedure: BREAST LUMPECTOMY WITH NEEDLE LOCALIZATION AND AXILLARY SENTINEL LYMPH NODE BX;  Surgeon: [name], MD;  Location: [facility];  Service: General;  Laterality: Left;    Family History  Problem Relation Age of Onset  . Cancer Mother     COLON  . Diabetes Sister   . Cancer Sister     COLON  . Heart disease Brother   . Cancer Father     throat  . Cancer Sister     stomach    Social History History  Substance Use Topics  . Smoking status: Current Every Day Smoker -- 0.50 packs/day for 43 years  . Smokeless tobacco: Not on file     Comment: 01/29/14 decreased to 3 cigarettes daily  . Alcohol Use: Yes    Allergies  Allergen Reactions  . Aspirin Nausea And Vomiting  . Effexor [Venlafaxine Hydrochloride] Other (See Comments)    Sees things   . Ivp Dye [Iodinated Diagnostic Agents]     Happened 1970s per pt-sob  . Penicillins     REACTION: hives/tongue swelled    Current Outpatient Prescriptions  Medication Sig Dispense Refill  . amLODipine (NORVASC)  5 MG tablet Take 5 mg by mouth daily.        Marland Kitchen anastrozole (ARIMIDEX) 1 MG tablet Take 1 tablet (1 mg total) by mouth daily.  90 tablet  12  . Biotin (PA BIOTIN) 1000 MCG tablet Take 1,000 mcg by mouth 3 (three) times daily.      Marland Kitchen emollient (BIAFINE) cream Apply topically 2 (two) times daily.      Marland Kitchen EPINEPHrine (EPI-PEN) 0.3 mg/0.3 mL DEVI Inject 0.3 mLs (0.3 mg total) into the muscle once.  2 Device  3  . hydrochlorothiazide (HYDRODIURIL) 25 MG tablet Take 25 mg by mouth at bedtime.      . Multiple Vitamins-Minerals (MULTIVITAMIN WITH MINERALS) tablet Take 1 tablet by mouth daily.      Marland Kitchen PARoxetine HCl (PAXIL PO) Take 10 mg by mouth See admin instructions. Pt takes only first 2 weeks of the month      . levothyroxine (SYNTHROID) 100 MCG tablet Take 1 tablet (100 mcg total) by mouth daily before breakfast.  30 tablet  3   No current  facility-administered medications for this visit.    Review of Systems Review of Systems  Constitutional: Negative for fever, chills and unexpected weight change.  HENT: Negative for hearing loss, congestion, sore throat, trouble swallowing and voice change.   Eyes: Negative for visual disturbance.  Respiratory: Negative for cough and wheezing.   Cardiovascular: Negative for chest pain, palpitations and leg swelling.  Gastrointestinal: Negative for nausea, vomiting, abdominal pain, diarrhea, constipation, blood in stool, abdominal distention and anal bleeding.  Genitourinary: Negative for hematuria, vaginal bleeding and difficulty urinating.  Musculoskeletal: Negative for arthralgias.  Skin: Negative for rash and wound.  Neurological: Negative for seizures, syncope and headaches.  Hematological: Negative for adenopathy. Does not bruise/bleed easily.  Psychiatric/Behavioral: Negative for confusion.    Blood pressure 150/60, pulse 72, temperature 97 F (36.1 C), resp. rate 18, height 5\' 3"  (1.6 m), weight 106 lb (48.081 kg).  Physical Exam Physical Exam  Constitutional: She is oriented to person, place, and time. She appears well-developed and well-nourished.  HENT:  Head: Normocephalic and atraumatic.  Eyes: Pupils are equal, round, and reactive to light.  Neck: Thyromegaly present.  Pulmonary/Chest: Effort normal and breath sounds normal. No stridor.  Musculoskeletal: Normal range of motion.  Lymphadenopathy:    She has no cervical adenopathy.  Neurological: She is alert and oriented to person, place, and time.  Skin: Skin is warm and dry.  Psychiatric: She has a normal mood and affect. Her behavior is normal. Judgment and thought content normal.    Data Reviewed Clinical Data: Increasing size of multinodular goiter  THYROID ULTRASOUND  Technique: Ultrasound examination of the thyroid gland and adjacent  soft tissues was performed.  Comparison: Ultrasound of the thyroid of  01/15/2010  Findings:  Right thyroid lobe: 5.4 x 1.6 x 1.8 cm. (Previously 5.5 x 1.4 x  1.6 cm).  Left thyroid lobe: 6.9 x 2.2 x 3.7 cm. (Previously 6.6 x 2.9 x  3.3 cm).  Isthmus: 5 mm in thickness.  Focal nodules: The echogenicity of the thyroid gland is diffusely  inhomogeneous. Multiple thyroid nodules are present primarily  throughout the left lobe and isthmus. A nodule in the left isthmus  which is solid measures 3.2 x 1.7 x 2.1 cm compared to prior  measurements of 3.1 x 1.4 x 2.2 cm. A solid nodule in the mid  upper left lobe measures 3.2 x 1.7 x 2.5 cm compared to prior  measurements of 3.0 x 1.7 x 2.9 cm. A solid nodule in the mid left  lobe measures 2.7 x 2.0 x 2.1 cm, compared to prior measurements of  2.7 x 1.7 x 2.0 cm. A solid nodule in the lower pole of the left  lobe measures 3.0 x 1.7 x 2.5 cm compared to prior measurements of  3.0 x 1.8 x 2.0 cm. Nodules on the right measure no more than 7 mm  in diameter.  Lymphadenopathy: None visualized.  IMPRESSION:  Stable multinodular goiter with the dominant nodules occupying much  of the left lobe as well as the superior isthmus. TSH 0.87 Assessment    Multinodular goiter stable since 2007 Hx of breast cancer 2015    Plan  pt has no interest in surgery and there is no change in the U/S over that time.  She had a FNA in 2007 that was benign.   She would like to try synthroid as a means to shrink  The nodules.  Will start 100 mcg day.  Pt stopped it when script ran out.    Target TSH less than 0.07.  Will recheck U/S to verify stability. Pt felt better while on synthroid and wants to restart this.  This will need to be followed by primary care MD in the future.  If nodules stable,  can follow up with primary care and follow TSH/ thyroid function.  Side effects of synthroid given.        Marianne Golightly A. 06/08/2014, 12:20 PM

## 2014-06-11 ENCOUNTER — Other Ambulatory Visit: Payer: Federal, State, Local not specified - PPO

## 2014-06-18 ENCOUNTER — Encounter: Payer: Self-pay | Admitting: *Deleted

## 2014-07-09 ENCOUNTER — Other Ambulatory Visit: Payer: Self-pay | Admitting: Gynecology

## 2014-07-09 ENCOUNTER — Encounter: Payer: Self-pay | Admitting: Gynecology

## 2014-07-09 ENCOUNTER — Ambulatory Visit (INDEPENDENT_AMBULATORY_CARE_PROVIDER_SITE_OTHER): Payer: Federal, State, Local not specified - PPO | Admitting: Gynecology

## 2014-07-09 DIAGNOSIS — M810 Age-related osteoporosis without current pathological fracture: Secondary | ICD-10-CM

## 2014-07-09 MED ORDER — ALENDRONATE SODIUM 70 MG PO TABS: 70.0000 mg | ORAL_TABLET | ORAL | Status: DC

## 2014-07-09 NOTE — Patient Instructions (Signed)
Start the alendronate pill weekly as we discussed. Call me if you have any issues with this.  Osteoporosis Throughout your life, your body breaks down old bone and replaces it with new bone. As you get older, your body does not replace bone as quickly as it breaks it down. By the age of 21 years, most people begin to gradually lose bone because of the imbalance between bone loss and replacement. Some people lose more bone than others. Bone loss beyond a specified normal degree is considered osteoporosis.  Osteoporosis affects the strength and durability of your bones. The inside of the ends of your bones and your flat bones, like the bones of your pelvis, look like honeycomb, filled with tiny open spaces. As bone loss occurs, your bones become less dense. This means that the open spaces inside your bones become bigger and the walls between these spaces become thinner. This makes your bones weaker. Bones of a person with osteoporosis can become so weak that they can break (fracture) during minor accidents, such as a simple fall. CAUSES  The following factors have been associated with the development of osteoporosis:  Smoking.  Drinking more than 2 alcoholic drinks several days per week.  Long-term use of certain medicines:  Corticosteroids.  Chemotherapy medicines.  Thyroid medicines.  Antiepileptic medicines.  Gonadal hormone suppression medicine.  Immunosuppression medicine.  Being underweight.  Lack of physical activity.  Lack of exposure to the sun. This can lead to vitamin D deficiency.  Certain medical conditions:  Certain inflammatory bowel diseases, such as Crohn disease and ulcerative colitis.  Diabetes.  Hyperthyroidism.  Hyperparathyroidism. RISK FACTORS Anyone can develop osteoporosis. However, the following factors can increase your risk of developing osteoporosis:  Gender--Women are at higher risk than men.  Age--Being older than 50 years increases your  risk.  Ethnicity--White and Asian people have an increased risk.  Weight --Being extremely underweight can increase your risk of osteoporosis.  Family history of osteoporosis--Having a family member who has developed osteoporosis can increase your risk. SYMPTOMS  Usually, people with osteoporosis have no symptoms.  DIAGNOSIS  Signs during a physical exam that may prompt your caregiver to suspect osteoporosis include:  Decreased height. This is usually caused by the compression of the bones that form your spine (vertebrae) because they have weakened and become fractured.  A curving or rounding of the upper back (kyphosis). To confirm signs of osteoporosis, your caregiver may request a procedure that uses 2 low-dose X-ray beams with different levels of energy to measure your bone mineral density (dual-energy X-ray absorptiometry [DXA]). Also, your caregiver may check your level of vitamin D. TREATMENT  The goal of osteoporosis treatment is to strengthen bones in order to decrease the risk of bone fractures. There are different types of medicines available to help achieve this goal. Some of these medicines work by slowing the processes of bone loss. Some medicines work by increasing bone density. Treatment also involves making sure that your levels of calcium and vitamin D are adequate. PREVENTION  There are things you can do to help prevent osteoporosis. Adequate intake of calcium and vitamin D can help you achieve optimal bone mineral density. Regular exercise can also help, especially resistance and weight-bearing activities. If you smoke, quitting smoking is an important part of osteoporosis prevention. MAKE SURE YOU:  Understand these instructions.  Will watch your condition.  Will get help right away if you are not doing well or get worse. FOR MORE INFORMATION www.osteo.org and EquipmentWeekly.com.ee Document  Released: 08/12/2005 Document Revised: 02/27/2013 Document Reviewed:  10/17/2011 [facility] Patient Information 2015 [place], [place]. This information is not intended to replace advice given to you by your health care provider. Make sure you discuss any questions you have with your health care provider.   Alendronate tablets What is this medicine? ALENDRONATE (a LEN droe nate) slows calcium loss from bones. It helps to make normal healthy bone and to slow bone loss in people with Paget's disease and osteoporosis. It may be used in others at risk for bone loss. This medicine may be used for other purposes; ask your health care provider or pharmacist if you have questions. COMMON BRAND NAME(S): Fosamax What should I tell my health care provider before I take this medicine? They need to know if you have any of these conditions: -dental disease -esophagus, stomach, or intestine problems, like acid reflux or GERD -kidney disease -low blood calcium -low vitamin D -problems sitting or standing 30 minutes -trouble swallowing -an unusual or allergic reaction to alendronate, other medicines, foods, dyes, or preservatives -pregnant or trying to get pregnant -breast-feeding How should I use this medicine? You must take this medicine exactly as directed or you will lower the amount of the medicine you absorb into your body or you may cause yourself harm. Take this medicine by mouth first thing in the morning, after you are up for the day. Do not eat or drink anything before you take your medicine. Swallow the tablet with a full glass (6 to 8 fluid ounces) of plain water. Do not take this medicine with any other drink. Do not chew or crush the tablet. After taking this medicine, do not eat breakfast, drink, or take any medicines or vitamins for at least 30 minutes. Sit or stand up for at least 30 minutes after you take this medicine; do not lie down. Do not take your medicine more often than directed. Talk to your pediatrician regarding the use of this medicine in children.  Special care may be needed. Overdosage: If you think you have taken too much of this medicine contact a poison control center or emergency room at once. NOTE: This medicine is only for you. Do not share this medicine with others. What if I miss a dose? If you miss a dose, do not take it later in the day. Continue your normal schedule starting the next morning. Do not take double or extra doses. What may interact with this medicine? -aluminum hydroxide -antacids -aspirin -calcium supplements -drugs for inflammation like ibuprofen, naproxen, and others -iron supplements -magnesium supplements -vitamins with minerals This list may not describe all possible interactions. Give your health care provider a list of all the medicines, herbs, non-prescription drugs, or dietary supplements you use. Also tell them if you smoke, drink alcohol, or use illegal drugs. Some items may interact with your medicine. What should I watch for while using this medicine? Visit your doctor or health care professional for regular checks ups. It may be some time before you see benefit from this medicine. Do not stop taking your medicine except on your doctor's advice. Your doctor or health care professional may order blood tests and other tests to see how you are doing. You should make sure you get enough calcium and vitamin D while you are taking this medicine, unless your doctor tells you not to. Discuss the foods you eat and the vitamins you take with your health care professional. Some people who take this medicine have severe bone, joint, and/or  muscle pain. This medicine may also increase your risk for a broken thigh bone. Tell your doctor right away if you have pain in your upper leg or groin. Tell your doctor if you have any pain that does not go away or that gets worse. This medicine can make you more sensitive to the sun. If you get a rash while taking this medicine, sunlight may cause the rash to get worse. Keep  out of the sun. If you cannot avoid being in the sun, wear protective clothing and use sunscreen. Do not use sun lamps or tanning beds/booths. What side effects may I notice from receiving this medicine? Side effects that you should report to your doctor or health care professional as soon as possible: -allergic reactions like skin rash, itching or hives, swelling of the face, lips, or tongue -black or tarry stools -bone, muscle or joint pain -changes in vision -chest pain -heartburn or stomach pain -jaw pain, especially after dental work -pain or trouble when swallowing -redness, blistering, peeling or loosening of the skin, including inside the mouth Side effects that usually do not require medical attention (report to your doctor or health care professional if they continue or are bothersome): -changes in taste -diarrhea or constipation -eye pain or itching -headache -nausea or vomiting -stomach gas or fullness This list may not describe all possible side effects. Call your doctor for medical advice about side effects. You may report side effects to FDA at 1-800-FDA-1088. Where should I keep my medicine? Keep out of the reach of children. Store at room temperature of 15 and 30 degrees C (59 and 86 degrees F). Throw away any unused medicine after the expiration date. NOTE: This sheet is a summary. It may not cover all possible information. If you have questions about this medicine, talk to your doctor, pharmacist, or health care provider.  2015, Elsevier/Gold Standard. (2011-05-01 08:56:09)

## 2014-07-09 NOTE — Progress Notes (Signed)
Jessica [name] [birth year] [id number]        62 y.o.  [id number] presents for discussion of her most recent DEXA which shows osteoporosis T score -3.1 AP spine. Prior DEXA 2009 with T score -2.7. History of approximately one year use of is done for sarcoid but no longer uses this. Transiently started on Evista but discontinued quickly due to the cost of the medicine after 2009 DEXA.  Being followed for breast cancer with recommended treatment with Arimidex by Dr. Lewayne Bunting although she has not started this because she read that it could cause loss of calcium from the bones. Currently smokes 2 cigarettes daily. No strong family history of osteoporotic fractures  Past medical history,surgical history, problem list, medications, allergies, family history and social history were all reviewed and documented in the EPIC chart.  Directed ROS with pertinent positives and negatives documented in the history of present illness/assessment and plan.   Assessment/Plan:  63 y.o. [id number] with osteoporosis and some deterioration from her prior DEXA 2009. I reviewed the situation and risks of fracture both current and future. I discussed her Arimidex and my recommendation that she follow up with Dr. Virgie Dad recommendation. The issues of decreasing her long-term recurrence risk with treatment reviewed. Medication options for osteoporosis treatment discussed. I reviewed alendronate, how to take the medication, its side effects and risks. GERD, osteonecrosis of the jaw, atypical fractures all discussed. My recommendations to increase weightbearing exercise, stop smoking and start alendronate discussed. Patient agrees with starting alendronate and I prescribed this for one year. Taken on empty stomach and avoiding of eating and drinking following, remaining upright afterwards all reviewed.  Call if any issues once initiating the treatment. Continue to followup with Dr Jana Hakim and his recommendations as far as her breast  cancer.   Note: This document was prepared with digital dictation and possible smart phrase technology. Any transcriptional errors that result from this process are unintentional.   Anastasio Auerbach MD, 4:58 PM 07/09/2014

## 2014-07-27 ENCOUNTER — Telehealth: Payer: Self-pay | Admitting: *Deleted

## 2014-07-27 NOTE — Telephone Encounter (Signed)
Pt called to this RN to state noted side effects occuring since starting the anastrozole.  Per discussion with pt she states joint discomfort occuring that is very noticeable due to " I have a job where I have to stand for long periods- also when I have been sitting down for a while- when I go to stand up I have to stretch my back before I can stand all the way up " " at night the pain wakes me up when I go to reposition myself "  Per phone discussion - anastrozole side effects discussed as well as use of over the counter medications like NSAID- note pt is allergic to aspirin ( N&V) but has no none issues with ibuprofen.  Pt also discussed recent prescription for fosamax per Dr Phineas Real secondary to results of bone density test.  Plan at present is pt wants to continue to take the anastrozole and will use aleve for discomfort.  Pt is scheduled for follow up in mid October but will call if symptoms are not relieved with interventions discussed.

## 2014-08-28 ENCOUNTER — Ambulatory Visit (HOSPITAL_BASED_OUTPATIENT_CLINIC_OR_DEPARTMENT_OTHER): Payer: Federal, State, Local not specified - PPO | Admitting: Oncology

## 2014-08-28 VITALS — BP 160/87 | HR 62 | Temp 97.7°F | Resp 20 | Ht 63.0 in | Wt 104.2 lb

## 2014-08-28 DIAGNOSIS — Z170 Estrogen receptor positive status [ER+]: Secondary | ICD-10-CM

## 2014-08-28 DIAGNOSIS — C50412 Malignant neoplasm of upper-outer quadrant of left female breast: Secondary | ICD-10-CM

## 2014-08-28 DIAGNOSIS — C50612 Malignant neoplasm of axillary tail of left female breast: Secondary | ICD-10-CM

## 2014-08-28 DIAGNOSIS — Z720 Tobacco use: Secondary | ICD-10-CM

## 2014-08-28 DIAGNOSIS — M810 Age-related osteoporosis without current pathological fracture: Secondary | ICD-10-CM

## 2014-08-28 DIAGNOSIS — C50212 Malignant neoplasm of upper-inner quadrant of left female breast: Secondary | ICD-10-CM

## 2014-08-28 NOTE — Progress Notes (Signed)
[place]  [phone] [phone]) [phone]     ID: [name] OB: May 31, 1962  MR#: [id number]  OEH#:[id number]  PCP: Gerrit Heck, MD GYN:  Freddie Apley SU: [name]; [name] OTHER MD: [name]; [name], [name]  CHIEF COMPLAINT: Early stage estrogen receptor positive breast cancer CURRENT TREATMENT: Anastrozole  HISTORY OF PRESENT ILLNESS: From the original intake note:  "Jessica [name]" has noted a mass in her left breast for at least several months. She didn't think it was worth bringing to medical attention. When she went for routine gynecologic followup under [name] NP, this was palpated in the patient was set up for bilateral diagnostic mammography 10/23/2013 at [facility]. She was found to have breast density category C. A new irregular mass was noted in the left axillary tail. Ultrasound was obtained the same day and showed a 1.4 cm lobulated mass in the left breast at the 2:00 position. Biopsy of this mass 11/06/2013 showed ([id number]) an invasive ductal carcinoma, grade 2 or 3, estrogen receptor 80% positive, with moderate staining intensity; progesterone receptor 90% positive with strong staining intensity; with an MIB-1 of 10% and no HER-2 amplification (these signals ratio was 1.54 in the copy number per cell was 2.00).  On 11/13/2013 the patient underwent bilateral breast MRI. This showed a 1.3 cm irregular enhancing mass in the upper outer quadrant of the left breast (axillary tail). There were no other masses of concern in either breast and no abnormal appearing lymph nodes.  The patient's subsequent history is as detailed below.  INTERVAL HISTORY: Jessica [name] returns today for followup of her breast cancer. After her last visit here she met with Dr. Phineas Real and he started her on anastrozole. She took it and within a couple of doses had significant problems with bone pain and aches. She took it for a total of 2  months and then discontinued it.  Keep he is still taking the anastrozole she has minimal hot flashes and no significant vaginal dryness problems from it. She never developed arthralgias or myalgias on that medication. She gets it at a [facility]  REVIEW OF SYSTEMS: Jessica [name] is doing "fabulous". 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 She describes herself is moderately fatigued. She has mild sinus symptoms. She has stress urinary incontinence. She feels a little anxious. Otherwise a detailed review of systems today was noncontributory  PAST MEDICAL HISTORY: Past Medical History  Diagnosis Date  . Cancer     BREAST CANCER 1990'S  . Thyroid nodule     BENIGN-DR CORNELL  . Hypertension     DR. KINGSLEY  . Anxiety   . Depression   . Vitamin D deficiency   . Smoker   . Osteoporosis 02/2014    T score -3.1 AP spine  . Hyperlipidemia   . GERD (gastroesophageal reflux disease)   . Hot flashes   . Breast cancer 11/06/13    left  . Hx of radiation therapy 01/17/14- 02/28/14    left breast 5000 cGy in 25 sessions, left breast boost 1000 cGy in 5 sessions    PAST SURGICAL HISTORY: Past Surgical History  Procedure Laterality Date  . Arm surgery      AT AGE 72-PLATE INSERTED-DUE TO FALL-lt  . Gynecologic cryosurgery  1980    CYTOTHERAPY OF  CERVIX  . Breast surgery  1994    rt lump  . Tubal ligation    . Biopsy thyroid    . Breast lumpectomy with needle localization and axillary sentinel lymph node bx Left 12/11/2013    Procedure: BREAST LUMPECTOMY WITH NEEDLE LOCALIZATION AND AXILLARY SENTINEL LYMPH NODE BX;  Surgeon: [name], MD;  Location: [facility];  Service: General;  Laterality: Left;    FAMILY HISTORY Family History  Problem Relation Age of Onset  . Cancer Mother     COLON  .  Diabetes Sister   . Cancer Sister     COLON  . Heart disease Brother   . Cancer Father     throat  . Cancer Sister     stomach   the patient's father died in his mid 82s with cancer of the throat. The patient's mother died at the age of 86 with colon cancer. The patient had 4 brothers and 5 sisters. One sister was diagnosed with stomach cancer but the patient does not know at what age. One sister was diagnosed with colon cancer at age 73. There is no history of breast or ovarian cancer in the family to the patient's knowledge.  GYNECOLOGIC HISTORY:  Menarche age 63, first live birth age 75, the patient is Diamond P4. She thinks she went through menopause approximately age 74. She did not take hormone replacement.  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 Her husband [name] (goes by [name]) is retired from the [employer]. He works part-time as a Horticulturist, commercial. Daughter [name] lives in [place] where she works as a Product/process development scientist. Daughter [name] lives in [place] where she works as a Cabin crew. Daughter [name] works as a Marine scientist in [employer] here in [place]. Daughter [name] works as a Emergency planning/management officer in [place]. The patient has 7 grandchildren. She is a Psychologist, forensic.     ADVANCED DIRECTIVES: In place   HEALTH MAINTENANCE: History  Substance Use Topics  . Smoking status: Current Every Day Smoker -- 0.50 packs/day for 43 years  . Smokeless tobacco: Not on file     Comment: 01/29/14 decreased to 3 cigarettes daily  . Alcohol Use: Yes     Colonoscopy:2005  PAP:2014  Bone density:Remote  Lipid panel:  Allergies  Allergen Reactions  . Aspirin Nausea And Vomiting  . Effexor [Venlafaxine Hydrochloride] Other (See Comments)    Sees things   . Ivp Dye [Iodinated Diagnostic Agents]     Happened 1970s per pt-sob  . Penicillins     REACTION: hives/tongue swelled    Current Outpatient Prescriptions  Medication Sig  Dispense Refill  . alendronate (FOSAMAX) 70 MG tablet TAKE 1 TABLET BY MOUTH EVERY 7 DAYS WITH A FULL GLASS OF WATER ON AN EMPTY STOMACH  12 tablet  3  . amLODipine (NORVASC) 5 MG tablet Take 5 mg by mouth daily.        Marland Kitchen anastrozole (ARIMIDEX) 1 MG tablet Take 1 tablet (1 mg total) by mouth daily.  90 tablet  12  . Biotin (PA BIOTIN) 1000 MCG tablet Take 1,000 mcg by mouth 3 (three) times daily.      Marland Kitchen emollient (BIAFINE) cream Apply topically 2 (two) times daily.      Marland Kitchen EPINEPHrine (EPI-PEN) 0.3 mg/0.3 mL DEVI Inject 0.3 mLs (0.3 mg total) into the muscle once.  2 Device  3  . hydrochlorothiazide (HYDRODIURIL) 25 MG tablet Take 25 mg by mouth  at bedtime.      Marland Kitchen levothyroxine (SYNTHROID, LEVOTHROID) 100 MCG tablet TAKE 1 TABLET BY MOUTH DAILY BEFORE BREAKFAST  90 tablet  3  . Multiple Vitamins-Minerals (MULTIVITAMIN WITH MINERALS) tablet Take 1 tablet by mouth daily.      Marland Kitchen PARoxetine HCl (PAXIL PO) Take 10 mg by mouth See admin instructions. Pt takes only first 2 weeks of the month       No current facility-administered medications for this visit.    OBJECTIVE: Middle-aged [place] American woman  in no acute distress  Filed Vitals:   08/28/14 1012  BP: 160/87  Pulse: 62  Temp: 97.7 F (36.5 C)  Resp: 20     Body mass index is 18.46 kg/(m^2).    ECOG FS:0 - Asymptomatic  Sclerae unicteric,EOMs intact  Oropharynx clear and moist No cervical or supraclavicular adenopathy; moderate thyromegaly as previously noted  Lungs no rales or rhonchi Heart regular rate and rhythm Abd soft, nontender, positive bowel sounds MSK  scoliosis but no focal spinal tenderness, no upper extremity lymphedema Neuro: nonfocal, well oriented, positive affect Breasts: The right breast is status post remote biopsy. There are no suspicious findings. The right axilla is benign. The left breast is status post lumpectomy and radiation. There is  mild residual hyperpigmentation over the radiation port. There is no  evidence of chest wall recurrence. The left axilla is benign.  LAB RESULTS:  CMP     Component Value Date/Time   NA 141 05/29/2014 1234   K 3.9 05/29/2014 1234   CO2 27 05/29/2014 1234   GLUCOSE 90 05/29/2014 1234   BUN 10.8 05/29/2014 1234   CREATININE 0.9 05/29/2014 1234   CALCIUM 9.8 05/29/2014 1234   PROT 8.0 05/29/2014 1234   ALBUMIN 4.3 05/29/2014 1234   AST 28 05/29/2014 1234   ALT 18 05/29/2014 1234   ALKPHOS 97 05/29/2014 1234   BILITOT 0.83 05/29/2014 1234    I No results found for this basename: SPEP,  UPEP,   kappa and lambda light chains    Lab Results  Component Value Date   WBC 5.8 05/29/2014   NEUTROABS 3.6 05/29/2014   HGB 13.4 05/29/2014   HCT 41.3 05/29/2014   MCV 89.6 05/29/2014   PLT 279 05/29/2014      Chemistry      Component Value Date/Time   NA 141 05/29/2014 1234   K 3.9 05/29/2014 1234   CO2 27 05/29/2014 1234   BUN 10.8 05/29/2014 1234   CREATININE 0.9 05/29/2014 1234      Component Value Date/Time   CALCIUM 9.8 05/29/2014 1234   ALKPHOS 97 05/29/2014 1234   AST 28 05/29/2014 1234   ALT 18 05/29/2014 1234   BILITOT 0.83 05/29/2014 1234       No results found for this basename: LABCA2    No components found with this basename: LABCA125    No results found for this basename: INR,  in the last 168 hours  Urinalysis No results found for this basename: colorurine,  appearanceur,  labspec,  phurine,  glucoseu,  hgbur,  bilirubinur,  ketonesur,  proteinur,  urobilinogen,  nitrite,  leukocytesur    STUDIES: DEXA scan 03/05/2014 [facility] showed osteoporosis, with a lowest T score of -3.0  ASSESSMENT: 63 y.o. [place] woman  (1) status post right upper inner quadrant lumpectomy 1994 for "breast cancer"; no data available; patient did not receive radiation, chemotherapy, or antiestrogen treatment  (2) status post left breast biopsy 11/06/2013 for  a clinical T1c N0, stage IA invasive ductal carcinoma, grade 2, estrogen and  progesterone receptor positive, HER-2 not amplified, with an MIB-1 of 10%  (3) genetics testing pending--concern regarding Lynch syndrome  (4) status post left lumpectomy and sentinel lymph node biopsy 12/11/2013 for apT1c pN0, stage IA invasive ductal carcinoma, grade 3, repeat HER-2 testing again negative  (5) Oncotype DX score of 16 predicts a 10 year risk of outside the breast recurrence of 10% if the patient's only systemic treatment is tamoxifen for 5 years. Is also predicts no benefit from chemotherapy.  (6) radiation therapy completed 02/28/2014  (7) started anastrozole July 2015--  discontinued September 2015 secondary to bone aches  (8) continuing cigarette abuse--discussed  PLAN: Jessica [name] is doing fine from a breast cancer point of view. The big issue with her I think aside from smoking is osteoporosis. She has been counseled regarding smoking cessation and the appropriate type of exercise to do as well as vitamin D supplementation. She was unable to tolerate the alendronate. She is following up with her gynecologist in December and they can discuss whether she would like to try different agents or perhaps go on denosumab/Prolia  From a breast cancer point of view the plan will be to continue anastrozole for a total of 5 years.  We discussed her strong family history of colon cancer. She has an appointment coming up with Dr. Currie Paris I am also scheduling her with our genetics counselor.    Jessica [name]  has a good understanding of the overall plan. She agrees with it. She knows the goal of treatment in her case  is cure. She will call with any problems that may develop before her next visit here. Chauncey Cruel, MD   08/28/2014 10:25 AM

## 2014-08-29 NOTE — Addendum Note (Signed)
Addended by: Jaci Carrel A on: 08/29/2014 09:52 AM   Modules accepted: Orders

## 2014-08-30 ENCOUNTER — Other Ambulatory Visit: Payer: Self-pay | Admitting: Oncology

## 2014-08-31 ENCOUNTER — Telehealth: Payer: Self-pay | Admitting: Oncology

## 2014-08-31 ENCOUNTER — Encounter: Payer: Self-pay | Admitting: Gastroenterology

## 2014-08-31 NOTE — Telephone Encounter (Signed)
lvm for pt regarding to [place], NOV, DEC and April 2016.....mailed pt appt sched/avs and letter

## 2014-09-04 ENCOUNTER — Telehealth: Payer: Self-pay | Admitting: *Deleted

## 2014-09-04 NOTE — Telephone Encounter (Signed)
Pt called requesting to speak with you about a procedure her oncologist recommended. Pt aware you are seeing patients. Call back # [phone]

## 2014-09-04 NOTE — Telephone Encounter (Signed)
Telephone call, answered questions concerning BRCA, she has 4 daughters, encouraged to follow through with recommended BRCA screening test recommended by Dr. Jana Hakim. Unable to tolerate anastrozole, horrible joint and muscle pain, difficulty with standing/walking/ mobility, stopped.  Able to tolerate Fosamax.

## 2014-09-13 ENCOUNTER — Encounter: Payer: Self-pay | Admitting: Genetic Counselor

## 2014-09-13 ENCOUNTER — Ambulatory Visit (HOSPITAL_BASED_OUTPATIENT_CLINIC_OR_DEPARTMENT_OTHER): Payer: Federal, State, Local not specified - PPO | Admitting: Genetic Counselor

## 2014-09-13 ENCOUNTER — Other Ambulatory Visit: Payer: Federal, State, Local not specified - PPO

## 2014-09-13 DIAGNOSIS — Z800 Family history of malignant neoplasm of digestive organs: Secondary | ICD-10-CM | POA: Insufficient documentation

## 2014-09-13 DIAGNOSIS — C50212 Malignant neoplasm of upper-inner quadrant of left female breast: Secondary | ICD-10-CM

## 2014-09-13 NOTE — Progress Notes (Signed)
Patient Name: Zailey Audia Patient Age: 63 y.o. Encounter Date: 09/13/2014  Referring Physician: Lurline Del, MD  Primary Care Provider: Gerrit Heck, MD   Ms. Tiani [name], a 63 y.o. female, is being seen at the [facility] due to a personal and family history of cancer. She presents to clinic today to discuss the possibility of a hereditary predisposition to cancer and discuss whether genetic testing is warranted.  HISTORY OF PRESENT ILLNESS: Ms. Key was diagnosed with breast cancer. She stated that she was diagnosed in the 1990s with a left breast cancer, but does not remember much other than "having it removed". 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 She is s/p lumpectomy and radiation.  Ms. Ressler stated that she had a colonoscopy in 2007 where polyps were removed. She does not recall the number or type(s) of polyps.   Past Medical History  Diagnosis Date  . Cancer     BREAST CANCER 1990'S  . Thyroid nodule     BENIGN-DR CORNELL  . Hypertension     DR. KINGSLEY  . Anxiety   . Depression   . Vitamin D deficiency   . Smoker   . Osteoporosis 02/2014    T score -3.1 AP spine  . Hyperlipidemia   . GERD (gastroesophageal reflux disease)   . Hot flashes   . Breast cancer 11/06/13    left  . Hx of radiation therapy 01/17/14- 02/28/14    left breast 5000 cGy in 25 sessions, left breast boost 1000 cGy in 5 sessions  . Family history of colon cancer     Past Surgical History  Procedure Laterality Date  . Arm surgery      AT AGE 63-PLATE INSERTED-DUE TO FALL-lt  . Gynecologic cryosurgery  1980    CYTOTHERAPY OF CERVIX  . Breast surgery  1994    rt lump  . Tubal ligation    . Biopsy thyroid    . Breast lumpectomy with needle localization and axillary sentinel lymph node bx Left 12/11/2013    Procedure: BREAST LUMPECTOMY WITH  NEEDLE LOCALIZATION AND AXILLARY SENTINEL LYMPH NODE BX;  Surgeon: [name], MD;  Location: [facility];  Service: General;  Laterality: Left;    History   Social History  . Marital Status: Married    Spouse Name: N/A    Number of Children: N/A  . Years of Education: N/A   Social History Main Topics  . Smoking status: Current Every Day Smoker -- 0.50 packs/day for 43 years  . Smokeless tobacco: Not on file     Comment: 01/29/14 decreased to 3 cigarettes daily  . Alcohol Use: Yes  . Drug Use: No  . Sexual Activity: No     Comment:  Menarche age 53, first live birth 39, P 15, menopause age 76, no HRT   Other Topics Concern  . Not on file   Social History Narrative  . No narrative on file     FAMILY HISTORY:   During the visit, a 4-generation pedigree was obtained. Family tree will be sent for scanning and will be in EPIC under the Media tab.  Significant diagnoses include the following:  Family History  Problem Relation Age of Onset  . Colon cancer Mother 31    deceased 1  . Diabetes Sister   . Stomach cancer Sister     Dx and died in 88s; maternal  half-sister  . Heart disease Brother   . Throat cancer Father     Dx and died in 40s; smoker  . Colon cancer Sister 59    currently 81; maternal half-sister    Of note, she has no full siblings. Her maternal half-siblings are also related through the paternal lineage because their father is Ms. [name]'s paternal uncle.  She has no information about her paternal grandparents.  Ms. Vice ancestry is African American and Native American. There is no known Jewish ancestry and no consanguinity.  ASSESSMENT AND PLAN: Ms. Chamblee is a 63 y.o. female with a personal history of possibly bilateral breast cancer and colon polyps as well as a family history of colon and stomach cancers. This history is somewhat suggestive of a hereditary predisposition to cancer, even though her mother was  not diagnosed at a young age. Her maternal half-sisters are also related to her through her paternal lineage. Their father was her paternal uncle. Given this connection and that her father died at a young age, genetic testing for hereditary colon cancer risk was recommended in addition to the hereditary breast cancer risk evaluation. We reviewed the characteristics, features and inheritance patterns of hereditary cancer syndromes. We also discussed genetic testing, including the process of testing, insurance coverage and implications of results. A negative result will be overall reassuring for her and her daughters, but we urged her to get a colonoscopy. She stated it is scheduled for next month.  Ms. Shifflett wished to pursue genetic testing and a blood sample will be sent to [facility] for analysis of the 32 genes on the CancerNext gene panel. This panel was chosen to combine the genes that are associated with hereditary breast cancer with those that are associated with colon cancer and colon polyps. We discussed the implications of a positive, negative and/ or Variant of Uncertain Significance (VUS) result. Results should be available in approximately 4-5 weeks, at which point we will contact her and address implications for her as well as address genetic testing for at-risk family members, if needed.    We encouraged Ms. [name] to remain in contact with Cancer Genetics annually so that we can update the family history and inform her of any changes in cancer genetics and testing that may be of benefit for this family. Ms.  Shinault questions were answered to her satisfaction today.   Thank you for the referral and allowing [place] to share in the care of your patient.   The patient was seen for a total of 35 minutes, greater than 50% of which was spent face-to-face counseling. This patient was discussed with the overseeing provider who agrees with the above.   Steele Berg, MS,  [place] Certified Genetic Counselor phone: [phone] [email or web address]

## 2014-10-16 ENCOUNTER — Ambulatory Visit (AMBULATORY_SURGERY_CENTER): Payer: Self-pay

## 2014-10-16 ENCOUNTER — Encounter: Payer: Self-pay | Admitting: Genetic Counselor

## 2014-10-16 VITALS — Ht 63.0 in | Wt 105.6 lb

## 2014-10-16 DIAGNOSIS — Z86010 Personal history of colonic polyps: Secondary | ICD-10-CM

## 2014-10-16 DIAGNOSIS — Z1379 Encounter for other screening for genetic and chromosomal anomalies: Secondary | ICD-10-CM | POA: Insufficient documentation

## 2014-10-16 DIAGNOSIS — Z800 Family history of malignant neoplasm of digestive organs: Secondary | ICD-10-CM

## 2014-10-16 MED ORDER — NA SULFATE-K SULFATE-MG SULF 17.5-3.13-1.6 GM/177ML PO SOLN: ORAL | Status: DC

## 2014-10-16 NOTE — Progress Notes (Signed)
GENETIC TEST RESULTS  Patient Name: Jessica [name] Patient Age: 63 y.o. Encounter Date: 10/16/2014  Referring Physician: Lurline Del, MD   Dear Ms. [name],  It was a pleasure meeting with you in the [facility] on 09/13/14. This letter is summary of our discussion and your genetic test results.   During the visit, a 4-generation family history was obtained. Significant diagnoses include the following:  Family History  Problem Relation Age of Onset  . Colon cancer Mother 28    deceased 31  . Diabetes Sister   . Stomach cancer Sister     Dx and died in 66s; maternal half-sister  . Heart disease Brother   . Throat cancer Father     Dx and died in 95s; smoker  . Colon cancer Sister 56    currently 1; maternal half-sister    GENETIC TESTING: We recommended you pursue genetic testing of the genes on the CancerNext gene panel. Your test, which included sequencing and deletion/duplication analysis, was performed at [facility]. Testing did not reveal any clearly harmful mutations in any of these genes. The genes on the panel were APC, ATM, BARD1, BRCA1, BRCA2, BRIP1, BMPR1A, CDH1, CDK4, CDKN2A, CHEK2, EPCAM, GREM1, MLH1, MRE11A, MSH2, MSH6, MUTYH, NBN, NF1, PALB2, PMS2, POLD1, POLE, PTEN, RAD50, RAD51C, RAD51D, SMAD4, SMARCA4, STK11, and TP53.  Since the current test is not perfect, it is possible there may be a mutation that current testing cannot detect, but that chance is small. It is also possible that a different genetic factor, which was not part of this testing or has not yet been discovered, is responsible for the cancer diagnoses in the family. Should you wish to discuss or pursue this additional testing, we are happy to coordinate this at any time, but do not feel that you are at significant risk of having a mutation in a different gene.     Genetic testing did detect a Variant of Unknown Significance (VUS) in the MRE11A gene called  p.M675L (c.2023A>T). At this time, it is unknown if this variant is associated with increased cancer risk or if this is a normal finding, but most variants such as this get reclassified to being normal human variation. It should not be used to make medical management decisions. With time, we suspect the lab will determine the significance of this variant, if any. If we do learn more about it, we will try to contact you to discuss it further. However, it is important to stay in touch with [place] periodically and keep the address and phone number up to date with [facility].  CANCER SCREENING: We recommend you continue to follow the screening guidelines provided by your physicians.  FAMILY MEMBERS: Women in your immediate family are at a somewhat increased risk of developing breast cancer simply due to the family history. We recommend that they have a yearly mammogram starting 10 years younger than the earliest diagnosed breast cancer, a yearly clinical breast exam, and a yearly gynecologic exam. Colon cancer screening in your family is recommended to begin 10 years younger than the earliest diagnosed colon cancer or at age 54, whichever comes first.     Family members should not pursue testing for the above VUS outside of a research setting as it has no implications for their medical management.  Lastly, we discussed that cancer genetics is a rapidly advancing field and it is possible that new genetic tests will be appropriate for you or your family in the future.  We encouraged you to remain in contact with [place] on an annual basis so we can update your personal and family histories, and let you know of advances in cancer genetics that may benefit the family.   Sincerely,  Steele Berg, MS, [facility] Certified Genetic Counselor phone: [phone] [email or web address]

## 2014-10-16 NOTE — Progress Notes (Signed)
Per pt, no allergies to soy or egg products.Pt not taking any weight loss meds or using  O2 at home. 

## 2014-10-24 ENCOUNTER — Ambulatory Visit (AMBULATORY_SURGERY_CENTER): Payer: Federal, State, Local not specified - PPO | Admitting: Gastroenterology

## 2014-10-24 ENCOUNTER — Encounter: Payer: Self-pay | Admitting: Gastroenterology

## 2014-10-24 VITALS — BP 151/66 | HR 40 | Temp 97.6°F | Resp 44 | Ht 63.0 in | Wt 105.0 lb

## 2014-10-24 DIAGNOSIS — D123 Benign neoplasm of transverse colon: Secondary | ICD-10-CM

## 2014-10-24 DIAGNOSIS — D124 Benign neoplasm of descending colon: Secondary | ICD-10-CM

## 2014-10-24 DIAGNOSIS — Z86010 Personal history of colonic polyps: Secondary | ICD-10-CM

## 2014-10-24 DIAGNOSIS — K648 Other hemorrhoids: Secondary | ICD-10-CM

## 2014-10-24 DIAGNOSIS — K5730 Diverticulosis of large intestine without perforation or abscess without bleeding: Secondary | ICD-10-CM

## 2014-10-24 MED ORDER — SODIUM CHLORIDE 0.9 % IV SOLN: 500.0000 mL | INTRAVENOUS | Status: DC

## 2014-10-24 NOTE — Patient Instructions (Signed)
Discharge instructions given. Handouts on polyps,diverticulosis and hemorrhoids. Resume previous medications. YOU HAD AN ENDOSCOPIC PROCEDURE TODAY AT THE [facility]: Refer to the procedure report that was given to you for any specific questions about what was found during the examination.  If the procedure report does not answer your questions, please call your gastroenterologist to clarify.  If you requested that your care partner not be given the details of your procedure findings, then the procedure report has been included in a sealed envelope for you to review at your convenience later.  YOU SHOULD EXPECT: Some feelings of bloating in the abdomen. Passage of more gas than usual.  Walking can help get rid of the air that was put into your GI tract during the procedure and reduce the bloating. If you had a lower endoscopy (such as a colonoscopy or flexible sigmoidoscopy) you may notice spotting of blood in your stool or on the toilet paper. If you underwent a bowel prep for your procedure, then you may not have a normal bowel movement for a few days.  DIET: Your first meal following the procedure should be a light meal and then it is ok to progress to your normal diet.  A half-sandwich or bowl of soup is an example of a good first meal.  Heavy or fried foods are harder to digest and may make you feel nauseous or bloated.  Likewise meals heavy in dairy and vegetables can cause extra gas to form and this can also increase the bloating.  Drink plenty of fluids but you should avoid alcoholic beverages for 24 hours.  ACTIVITY: Your care partner should take you home directly after the procedure.  You should plan to take it easy, moving slowly for the rest of the day.  You can resume normal activity the day after the procedure however you should NOT DRIVE or use heavy machinery for 24 hours (because of the sedation medicines used during the test).    SYMPTOMS TO REPORT IMMEDIATELY: A  gastroenterologist can be reached at any hour.  During normal business hours, 8:30 AM to 5:00 PM Monday through Friday, call [phone].  After hours and on weekends, please call the GI answering service at [phone] who will take a message and have the physician on call contact you.   Following lower endoscopy (colonoscopy or flexible sigmoidoscopy):  Excessive amounts of blood in the stool  Significant tenderness or worsening of abdominal pains  Swelling of the abdomen that is new, acute  Fever of 100F or higher  FOLLOW UP: If any biopsies were taken you will be contacted by phone or by letter within the next 1-3 weeks.  Call your gastroenterologist if you have not heard about the biopsies in 3 weeks.  Our staff will call the home number listed on your records the next business day following your procedure to check on you and address any questions or concerns that you may have at that time regarding the information given to you following your procedure. This is a courtesy call and so if there is no answer at the home number and we have not heard from you through the emergency physician on call, we will assume that you have returned to your regular daily activities without incident.  SIGNATURES/CONFIDENTIALITY: You and/or your care partner have signed paperwork which will be entered into your electronic medical record.  These signatures attest to the fact that that the information above on your After Visit Summary has been reviewed   and is understood.  Full responsibility of the confidentiality of this discharge information lies with you and/or your care-partner. 

## 2014-10-24 NOTE — Op Note (Signed)
[facility] [place]  [address] [place], [address]   COLONOSCOPY PROCEDURE REPORT  PATIENT: Jessica, [name]  MR#: [id number] BIRTHDATE: 12/22/1950 , 63  yrs. old GENDER: female ENDOSCOPIST: Inda Castle, MD REFERRED BY: PROCEDURE DATE:  10/24/2014 PROCEDURE:   Colonoscopy with cold biopsy polypectomy and Colonoscopy with snare polypectomy First Screening Colonoscopy - Avg.  risk and is 50 yrs.  old or older - No.  Prior Negative Screening - Now for repeat screening. N/A  History of Adenoma - Now for follow-up colonoscopy & has been > or = to 3 yrs.  Yes hx of adenoma.  Has been 3 or more years since last colonoscopy.  Polyps Removed Today? Yes. ASA CLASS:   Class II INDICATIONS:high risk personal history of colonic polyps 2009 and patient's immediate family history of colon cancer. MEDICATIONS: Propofol 250 mg IV  DESCRIPTION OF PROCEDURE:   After the risks benefits and alternatives of the procedure were thoroughly explained, informed consent was obtained.  The digital rectal exam revealed no abnormalities of the rectum.   The [id number]  endoscope was introduced through the anus and advanced to the cecum, which was identified by both the appendix and ileocecal valve. No adverse events experienced.   The quality of the prep was excellent using Suprep  The instrument was then slowly withdrawn as the colon was fully examined.      COLON FINDINGS: There was moderate diverticulosis noted in the descending colon and sigmoid colon.   Two sessile polyps ranging from 2 to 45mm in size were found at the splenic flexure.  A polypectomy was performed with a cold snare.  The resection was complete, the polyp tissue was completely retrieved and sent to histology.   A flat polyp measuring 2 mm in size was found at the hepatic flexure.  A polypectomy was performed with cold forceps. A sessile polyp measuring 3 mm in size was found in the descending colon.  A  polypectomy was performed with a cold snare.  The resection was complete, the polyp tissue was completely retrieved and sent to histology.   Internal hemorrhoids were found. Retroflexed views revealed no abnormalities. The time to cecum=8 minutes 08 seconds.  Withdrawal time=13 minutes 01 seconds.  The scope was withdrawn and the procedure completed. COMPLICATIONS: There were no immediate complications.  ENDOSCOPIC IMPRESSION: 1.   Moderate diverticulosis was noted in the descending colon and sigmoid colon 2.   Two sessile polyps ranging from 2 to 64mm in size were found at the splenic flexure; polypectomy was performed with a cold snare 3.   Flat polyp was found at the hepatic flexure; polypectomy was performed with cold forceps 4.   Sessile polyp was found in the descending colon; polypectomy was performed with a cold snare 5.   Internal hemorrhoids  RECOMMENDATIONS: Given your significant family history of colon cancer, you should have a repeat colonoscopy in 5 years  eSigned:  Inda Castle, MD 10/24/2014 3:44 PM   cc: Leighton Ruff, MD   PATIENT NAMEBreda, [name] MR#: [id number]

## 2014-10-24 NOTE — Progress Notes (Signed)
Called to room to assist during endoscopic procedure.  Patient ID and intended procedure confirmed with present staff. Received instructions for my participation in the procedure from the performing physician.  

## 2014-10-24 NOTE — Progress Notes (Signed)
A/ox3, pleased with MAC, report to RN 

## 2014-10-25 ENCOUNTER — Telehealth: Payer: Self-pay | Admitting: Gastroenterology

## 2014-10-25 ENCOUNTER — Telehealth: Payer: Self-pay | Admitting: *Deleted

## 2014-10-25 NOTE — Telephone Encounter (Signed)
Message left

## 2014-10-25 NOTE — Telephone Encounter (Signed)
Pt. Called back to inform us that she had a lot of sneezing and congestion after the procedure, she stated that she told the Crna when he placed the nasal cannula in her nose and turned the oxygen on that it was burning,  she stated that he said he would turn it down. Pt. Stated that she was sneezing a lot last night,but today it is calming down. Pt. Denies abdominal pain,she stated that she is good today. Informed pt. That the oxygen is probably what caused the issues she was experiencing she stated that she did not want to ever have that thing in her nose again.

## 2014-10-30 ENCOUNTER — Encounter: Payer: Self-pay | Admitting: Gastroenterology

## 2014-12-28 ENCOUNTER — Other Ambulatory Visit: Payer: Federal, State, Local not specified - PPO

## 2014-12-31 ENCOUNTER — Other Ambulatory Visit: Payer: Federal, State, Local not specified - PPO

## 2015-01-03 ENCOUNTER — Ambulatory Visit
Admission: RE | Admit: 2015-01-03 | Discharge: 2015-01-03 | Disposition: A | Discharge status: Home / Self Care | Payer: Federal, State, Local not specified - PPO | Source: Ambulatory Visit | Attending: Oncology | Admitting: Oncology

## 2015-01-03 ENCOUNTER — Other Ambulatory Visit: Payer: Self-pay | Admitting: Oncology

## 2015-01-03 ENCOUNTER — Inpatient Hospital Stay: Admission: RE | Admit: 2015-01-03 | Payer: Federal, State, Local not specified - PPO | Source: Ambulatory Visit

## 2015-01-03 ENCOUNTER — Other Ambulatory Visit: Payer: Self-pay | Admitting: *Deleted

## 2015-01-03 DIAGNOSIS — E049 Nontoxic goiter, unspecified: Secondary | ICD-10-CM

## 2015-01-04 NOTE — Telephone Encounter (Signed)
none

## 2015-02-05 ENCOUNTER — Other Ambulatory Visit (HOSPITAL_COMMUNITY)
Admission: RE | Admit: 2015-02-05 | Discharge: 2015-02-05 | Disposition: A | Discharge status: Home / Self Care | Payer: Federal, State, Local not specified - PPO | Source: Ambulatory Visit | Attending: Women's Health | Admitting: Women's Health

## 2015-02-05 ENCOUNTER — Encounter: Payer: Self-pay | Admitting: Women's Health

## 2015-02-05 ENCOUNTER — Ambulatory Visit (INDEPENDENT_AMBULATORY_CARE_PROVIDER_SITE_OTHER): Payer: Federal, State, Local not specified - PPO | Admitting: Women's Health

## 2015-02-05 VITALS — BP 110/76 | Ht 63.0 in | Wt 104.2 lb

## 2015-02-05 DIAGNOSIS — N898 Other specified noninflammatory disorders of vagina: Secondary | ICD-10-CM | POA: Diagnosis not present

## 2015-02-05 DIAGNOSIS — M810 Age-related osteoporosis without current pathological fracture: Secondary | ICD-10-CM | POA: Diagnosis not present

## 2015-02-05 DIAGNOSIS — A499 Bacterial infection, unspecified: Secondary | ICD-10-CM

## 2015-02-05 DIAGNOSIS — Z1151 Encounter for screening for human papillomavirus (HPV): Secondary | ICD-10-CM | POA: Insufficient documentation

## 2015-02-05 DIAGNOSIS — N760 Acute vaginitis: Secondary | ICD-10-CM | POA: Diagnosis not present

## 2015-02-05 DIAGNOSIS — Z01419 Encounter for gynecological examination (general) (routine) without abnormal findings: Secondary | ICD-10-CM | POA: Insufficient documentation

## 2015-02-05 DIAGNOSIS — B9689 Other specified bacterial agents as the cause of diseases classified elsewhere: Secondary | ICD-10-CM

## 2015-02-05 LAB — WET PREP FOR TRICH, YEAST, CLUE
Trich, Wet Prep: NONE SEEN
YEAST WET PREP: NONE SEEN

## 2015-02-05 MED ORDER — METRONIDAZOLE 0.75 % VA GEL: VAGINAL | Status: DC

## 2015-02-05 MED ORDER — ALENDRONATE SODIUM 70 MG PO TABS: ORAL_TABLET | ORAL | Status: DC
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
## 2015-02-05 NOTE — Patient Instructions (Signed)

## 2015-02-08 LAB — CYTOLOGY - PAP

## 2015-02-18 ENCOUNTER — Other Ambulatory Visit: Payer: Self-pay | Admitting: *Deleted

## 2015-02-18 DIAGNOSIS — C50212 Malignant neoplasm of upper-inner quadrant of left female breast: Secondary | ICD-10-CM

## 2015-02-19 ENCOUNTER — Telehealth: Payer: Self-pay | Admitting: Oncology

## 2015-02-19 ENCOUNTER — Other Ambulatory Visit (HOSPITAL_BASED_OUTPATIENT_CLINIC_OR_DEPARTMENT_OTHER): Payer: Federal, State, Local not specified - PPO

## 2015-02-19 ENCOUNTER — Ambulatory Visit (HOSPITAL_BASED_OUTPATIENT_CLINIC_OR_DEPARTMENT_OTHER): Payer: Federal, State, Local not specified - PPO | Admitting: Oncology

## 2015-02-19 VITALS — BP 134/80 | HR 60 | Temp 97.5°F | Resp 18 | Ht 63.0 in | Wt 102.0 lb

## 2015-02-19 DIAGNOSIS — Z170 Estrogen receptor positive status [ER+]: Secondary | ICD-10-CM | POA: Diagnosis not present

## 2015-02-19 DIAGNOSIS — C50919 Malignant neoplasm of unspecified site of unspecified female breast: Secondary | ICD-10-CM

## 2015-02-19 DIAGNOSIS — Z720 Tobacco use: Secondary | ICD-10-CM

## 2015-02-19 DIAGNOSIS — C50612 Malignant neoplasm of axillary tail of left female breast: Secondary | ICD-10-CM

## 2015-02-19 DIAGNOSIS — C50212 Malignant neoplasm of upper-inner quadrant of left female breast: Secondary | ICD-10-CM

## 2015-02-19 LAB — COMPREHENSIVE METABOLIC PANEL (CC13)
ALBUMIN: 4.4 g/dL (ref 3.5–5.0)
ALT: 15 U/L (ref 0–55)
AST: 22 U/L (ref 5–34)
Alkaline Phosphatase: 74 U/L (ref 40–150)
Anion Gap: 10 mEq/L (ref 3–11)
BUN: 10.6 mg/dL (ref 7.0–26.0)
CO2: 25 mEq/L (ref 22–29)
Calcium: 9.2 mg/dL (ref 8.4–10.4)
Chloride: 107 mEq/L (ref 98–109)
Creatinine: 0.8 mg/dL (ref 0.6–1.1)
GLUCOSE: 114 mg/dL (ref 70–140)
Potassium: 4.4 mEq/L (ref 3.5–5.1)
SODIUM: 141 meq/L (ref 136–145)
TOTAL PROTEIN: 7.9 g/dL (ref 6.4–8.3)
Total Bilirubin: 0.79 mg/dL (ref 0.20–1.20)

## 2015-02-19 LAB — CBC WITH DIFFERENTIAL/PLATELET
BASO%: 1.1 % (ref 0.0–2.0)
BASOS ABS: 0.1 10*3/uL (ref 0.0–0.1)
EOS%: 2.5 % (ref 0.0–7.0)
Eosinophils Absolute: 0.1 10*3/uL (ref 0.0–0.5)
HCT: 41.8 % (ref 34.8–46.6)
HEMOGLOBIN: 13.6 g/dL (ref 11.6–15.9)
LYMPH%: 38.8 % (ref 14.0–49.7)
MCH: 29.3 pg (ref 25.1–34.0)
MCHC: 32.5 g/dL (ref 31.5–36.0)
MCV: 90.1 fL (ref 79.5–101.0)
MONO#: 0.4 10*3/uL (ref 0.1–0.9)
MONO%: 7.6 % (ref 0.0–14.0)
NEUT#: 2.4 10*3/uL (ref 1.5–6.5)
NEUT%: 50 % (ref 38.4–76.8)
Platelets: 254 10*3/uL (ref 145–400)
RBC: 4.64 10*6/uL (ref 3.70–5.45)
RDW: 13 % (ref 11.2–14.5)
WBC: 4.7 10*3/uL (ref 3.9–10.3)
lymph#: 1.8 10*3/uL (ref 0.9–3.3)

## 2015-02-19 MED ORDER — TAMOXIFEN CITRATE 20 MG PO TABS: 20.0000 mg | ORAL_TABLET | Freq: Every day | ORAL | Status: AC

## 2015-02-19 NOTE — Telephone Encounter (Signed)
Appointments made and avs printed for patient °

## 2015-02-19 NOTE — Progress Notes (Signed)
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 She didn't think it was worth bringing to medical attention. When she went for routine gynecologic followup under Jessica Alas NP, this was palpated in the patient was set up for bilateral diagnostic mammography 10/23/2013 at [facility]. She was found to have breast density category C. A new irregular mass was noted in the left axillary tail. Ultrasound was obtained the same day and showed a 1.4 cm lobulated mass in the left breast at the 2:00 position. Biopsy of this mass 11/06/2013 showed ([id number]) an invasive ductal carcinoma, grade 2 or 3, estrogen receptor 80% positive, with moderate staining intensity; progesterone receptor 90% positive with strong staining intensity; with an MIB-1 of 10% and no HER-2 amplification (these signals ratio was 1.54 in the copy number per cell was 2.00).  On 11/13/2013 the patient underwent bilateral breast MRI. This showed a 1.3 cm irregular enhancing mass in the upper outer quadrant of the left breast (axillary tail). There were no other masses of concern in either breast and no abnormal appearing lymph nodes.  The patient's subsequent history is as detailed below.  INTERVAL HISTORY: Jessica [name] returns today for followup of her breast cancer. The interval history is generally unremarkable. There are some marital problems at home, which she is very busy with her work and sees the rest of the family on holidays. Unfortunately she continues to  smoke.  REVIEW OF SYSTEMS: Jessica [name] tells me when she takes a Synthroid it causes her to have hot flashes 100 rd her up. She is thinking of stopping that medication. Her weight goes up and down. She eats "like a horse" but she is so active that she doesn't gain weight. She has some sinus problems which are seasonal. She has pain in some joints here and there but this is not more persistent or intense than before. She feels anxious but not depressed. A detailed review of systems today was otherwise stable  PAST MEDICAL HISTORY: Past Medical History  Diagnosis Date  . Cancer     BREAST CANCER 1990'S  . Thyroid nodule     BENIGN-DR CORNELL  . Hypertension     DR. KINGSLEY  . Anxiety   . Depression   . Vitamin D deficiency   . Smoker   . Osteoporosis 02/2014    T score -3.1 AP spine  . Hyperlipidemia   . GERD (gastroesophageal reflux disease)   . Hot flashes   . Breast cancer 11/06/13    left  . Hx of radiation therapy 01/17/14- 02/28/14    left breast 5000 cGy in 25 sessions, left breast boost 1000 cGy in 5 sessions  . Family history of colon cancer   . Genetic testing     PAST SURGICAL HISTORY: Past Surgical History  Procedure Laterality Date  . Arm surgery      AT AGE 84-PLATE INSERTED-DUE TO FALL-lt  . Gynecologic cryosurgery  1980    CYTOTHERAPY OF CERVIX  . Breast surgery  1994    rt lump  . Tubal ligation    .  Biopsy thyroid    . Breast lumpectomy with needle localization and axillary sentinel lymph node bx Left 12/11/2013    Procedure: BREAST LUMPECTOMY WITH NEEDLE LOCALIZATION AND AXILLARY SENTINEL LYMPH NODE BX;  Surgeon: Jessica Bookbinder, MD;  Location: [facility];  Service: General;  Laterality: Left;    FAMILY HISTORY Family History  Problem Relation Age of Onset  . Colon cancer Mother 51    deceased 6  . Stomach cancer Sister     Dx and died in 70s; maternal half-sister  . Heart disease Brother   . Throat cancer Father     Dx and died in 33s;  smoker  . Colon cancer Sister 50    currently 54; maternal half-sister   the patient's father died in his mid 78s with cancer of the throat. The patient's mother died at the age of 81 with colon cancer. The patient had 4 brothers and 5 sisters. One sister was diagnosed with stomach cancer but the patient does not know at what age. One sister was diagnosed with colon cancer at age 44. There is no history of breast or ovarian cancer in the family to the patient's knowledge.  GYNECOLOGIC HISTORY:  Menarche age 87, first live birth age 39, the patient is [name]. She thinks she went through menopause approximately age 25. She did not take hormone replacement.  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 Her (second) husband Jessica [name] (goes by Jessica [name]) is retired from the [employer]. He works part-time as a Horticulturist, commercial. Daughter Jessica [name] lives in [place] where she works as a Product/process development scientist. Daughter Jessica [name] lives in [place] where she works as a Cabin crew. Daughter Jessica [name] works as a Marine scientist in [employer] here in [place]. Daughter Jessica [name] works as a Emergency planning/management officer in [place]. The patient has 7 grandchildren. She is a Psychologist, forensic.     ADVANCED DIRECTIVES: In place; she has named her daughter Jessica [name] as healthcare power of attorney   HEALTH MAINTENANCE: History  Substance Use Topics  . Smoking status: Current Every Day Smoker -- 0.50 packs/day for 43 years    Types: Cigarettes  . Smokeless tobacco: Never Used     Comment: 01/29/14 decreased to 3 cigarettes daily  . Alcohol Use: 0.6 oz/week    1 Glasses of wine per week     Colonoscopy:2005  PAP:2014  Bone density:Remote  Lipid panel:  Allergies  Allergen Reactions  . Aspirin Nausea And Vomiting  . Effexor [Venlafaxine Hydrochloride] Other (See Comments)    Sees things   . Ivp Dye [Iodinated Diagnostic Agents]     Happened 1970s per pt-sob  . Penicillins      REACTION: hives/tongue swelled    Current Outpatient Prescriptions  Medication Sig Dispense Refill  . alendronate (FOSAMAX) 70 MG tablet Take one tablet weekly with full glass of water, 4 tablet 11  . amLODipine (NORVASC) 5 MG tablet Take 5 mg by mouth daily.      Marland Kitchen anastrozole (ARIMIDEX) 1 MG tablet Take 1 tablet (1 mg total) by mouth daily. 90 tablet 12  . Biotin (PA BIOTIN) 1000 MCG tablet Take 1,000 mcg by mouth 3 (three) times daily.    Marland Kitchen emollient (BIAFINE) cream Apply topically as needed.     Marland Kitchen EPINEPHrine (EPI-PEN) 0.3 mg/0.3 mL DEVI Inject 0.3 mLs (0.3 mg total) into the muscle once. 2 Device 3  . hydrochlorothiazide (HYDRODIURIL) 25 MG tablet Take 25 mg by mouth at  bedtime.    Marland Kitchen levothyroxine (SYNTHROID, LEVOTHROID) 100 MCG tablet TAKE 1 TABLET BY MOUTH DAILY BEFORE BREAKFAST (Patient not taking: Reported on 10/16/2014) 90 tablet 3  . metroNIDAZOLE (METROGEL VAGINAL) 0.75 % vaginal gel 1 applicator per vagina at HS x 5 70 g 0  . Multiple Vitamins-Minerals (MULTIVITAMIN WITH MINERALS) tablet Take 1 tablet by mouth daily.    Marland Kitchen PARoxetine HCl (PAXIL PO) Take 10 mg by mouth See admin instructions. Pt takes only first 2 weeks of the month     No current facility-administered medications for this visit.    OBJECTIVE: Middle-aged [place] American woman who appears stated age 64 Vitals:   02/19/15 1128  BP: 134/80  Pulse: 60  Temp: 97.5 F (36.4 C)  Resp: 18     Body mass index is 18.07 kg/(m^2).    ECOG FS:1 - Symptomatic but completely ambulatory  Sclerae unicteric,EOMs intact  Oropharynx clear and moist No cervical or supraclavicular adenopathy; goiter as before Lungs no rales or rhonchi Heart regular rate and rhythm Abd soft, nontender, positive bowel sounds MSK  no focal spinal tenderness, no upper extremity lymphedema Neuro: nonfocal, well oriented, positive affect Breasts: there is no evidence of recurrence in either breast; both axillae are benign  LAB  RESULTS:  CMP     Component Value Date/Time   NA 141 05/29/2014 1234   K 3.9 05/29/2014 1234   CO2 27 05/29/2014 1234   GLUCOSE 90 05/29/2014 1234   BUN 10.8 05/29/2014 1234   CREATININE 0.9 05/29/2014 1234   CALCIUM 9.8 05/29/2014 1234   PROT 8.0 05/29/2014 1234   ALBUMIN 4.3 05/29/2014 1234   AST 28 05/29/2014 1234   ALT 18 05/29/2014 1234   ALKPHOS 97 05/29/2014 1234   BILITOT 0.83 05/29/2014 1234    I No results found for: SPEP  Lab Results  Component Value Date   WBC 4.7 02/19/2015   NEUTROABS 2.4 02/19/2015   HGB 13.6 02/19/2015   HCT 41.8 02/19/2015   MCV 90.1 02/19/2015   PLT 254 02/19/2015      Chemistry      Component Value Date/Time   NA 141 05/29/2014 1234   K 3.9 05/29/2014 1234   CO2 27 05/29/2014 1234   BUN 10.8 05/29/2014 1234   CREATININE 0.9 05/29/2014 1234      Component Value Date/Time   CALCIUM 9.8 05/29/2014 1234   ALKPHOS 97 05/29/2014 1234   AST 28 05/29/2014 1234   ALT 18 05/29/2014 1234   BILITOT 0.83 05/29/2014 1234       No results found for: LABCA2  No components found for: LABCA125  No results for input(s): INR in the last 168 hours.  Urinalysis No results found for: COLORURINE  STUDIES:  THYROID ULTRASOUND  TECHNIQUE: Ultrasound examination of the thyroid gland and adjacent soft tissues was performed.  COMPARISON: 09/21/2012  FINDINGS: Right thyroid lobe  Measurements: 5.6 cm x 1.6 cm x 1.7 cm. Two focal nodules on the right.  Superior: 7.5 mm x 4.8 mm x 6.0 is mm with cystic characteristics.  Inferior: Heterogeneous region of the posterior right thyroid with ill-defined borders is measured as 1.5 cm x 8 mm x 1.2 cm, though is favored to represent a pseudo lesion, given the lack of borders, and a maintained contour/ margin of the posterior thyroid.  Left thyroid lobe  Measurements: 5.6 cm x 2.5 cm x 3.7 cm. Heterogeneous and multilobulated appearance of the left thyroid again  demonstrated, similar to the prior.  Largest measured region is medial, involving the isthmus, measuring 3.9 cm x 1.7 cm x 2.2 cm. This has enlarged from a previous greatest measurement of 3.2 cm.  There are additional measured nodules:  Upper: 1.7 cm x 9 mm x 1.6 cm  Mid: 3.1 cm x 1.8 cm x 2.6 cm  Mid: 3.1 cm x 1.8 cm x 2.4 cm  Inferior: 2.3 cm x 2.0 cm x 2.7 cm  All of these nodules have heterogeneous echotexture, and none of them demonstrate internal reflectors to the suggest calcifications. All of these nodules maintain hypoechoic border with no infiltrative margins. For the most part the nodules are inseparable from adjacent nodules.  Isthmus  Thickness: 4 mm. No nodules visualized.  Lymphadenopathy  None visualized.  IMPRESSION: Multinodular thyroid again demonstrated, with a conglomerate of inseparable nodules occupying the left thyroid lobe. The left medial/ isthmic nodule is the dominant nodule, and has been previously biopsied in 2007. Recommend correlation with prior biopsy result. There has been interval growth of this left isthmic nodule from 3.2 cm to 3.9 cm.  The remainder of the nodules remain relatively stable over the course of multiple examinations and, with the exception of size criteria, demonstrate benign features.  Findings do not strictly meet current SRU consensus criteria for biopsy. Follow-up by clinical exam is recommended. If patient has known risk factors for thyroid carcinoma, consider follow-up ultrasound in 12 months. If patient is clinically hyperthyroid, consider nuclear medicine thyroid uptake and scan.Reference: Management of Thyroid Nodules Detected at [place]: Society of Radiologists in [place]. Radiology 2005; [id number].  Signed,  Dulcy Fanny. Earleen Newport, DO  [facility]  [facility]   Electronically Signed  By: Corrie Mckusick D.O.  On: 01/04/2015 09:05   ASSESSMENT: 64 y.o. [place] woman  (1) status post right upper inner quadrant lumpectomy 1994 for "breast cancer"; no data available; patient did not receive radiation, chemotherapy, or antiestrogen treatment  (2) status post left breast biopsy 11/06/2013 for a clinical T1c N0, stage IA invasive ductal carcinoma, grade 2, estrogen and progesterone receptor positive, HER-2 not amplified, with an MIB-1 of 10%  (3) genetics testing to be considered--(Lynch syndrome?)  (4) status post left lumpectomy and sentinel lymph node biopsy 12/11/2013 for a pT1c pN0, stage IA invasive ductal carcinoma, grade 3, repeat HER-2 testing again negative  (5) Oncotype DX score of 16 predicts a 10 year risk of outside the breast recurrence of 10% if the patient's only systemic treatment is tamoxifen for 5 years. Is also predicts no benefit from chemotherapy.  (6) radiation therapy completed 02/28/2014  (7) started anastrozole July 2015--  discontinued September 2015 secondary to bone aches  (8) continuing cigarette abuse--currently patient not ready to work towards quitting  (9) tamoxifen prescribed 02/19/2015  PLAN: Jessica [name] is doing well overall. She considered stopping her Synthroid but I think if she does her goiter will continue to grow and eventually she may need surgery. She is willing to take it on that basis although it does cause her some side effects  We discussed smoking cessation today. She is simply not ready to move in that direction.  We then discussed antiestrogen. We went over the possible side effects, toxicities and complications of tamoxifen including the current concern regarding blood clots, endometrial polyps and the rare case of endometrial cancer. She agreed to consider it and I went ahead and place the prescription.  I'm making her a return appointment in 3 months. At that point we  can assess how she is tolerating the tamoxifen. We will also  follow up on the question of genetics testing (and colonoscopy).  From that point we will likely start her on an every 6 month follow-up schedule  She knows to call for any problems that may develop before her next visit here. Chauncey Cruel, MD   02/19/2015 11:44 AM

## 2015-05-28 ENCOUNTER — Encounter: Payer: Self-pay | Admitting: Nurse Practitioner

## 2015-05-28 ENCOUNTER — Ambulatory Visit (HOSPITAL_BASED_OUTPATIENT_CLINIC_OR_DEPARTMENT_OTHER): Payer: Federal, State, Local not specified - PPO | Admitting: Nurse Practitioner

## 2015-05-28 ENCOUNTER — Other Ambulatory Visit (HOSPITAL_BASED_OUTPATIENT_CLINIC_OR_DEPARTMENT_OTHER): Payer: Federal, State, Local not specified - PPO

## 2015-05-28 ENCOUNTER — Telehealth: Payer: Self-pay | Admitting: Oncology

## 2015-05-28 VITALS — BP 162/74 | HR 50 | Temp 97.6°F | Resp 18 | Ht 63.0 in | Wt 99.8 lb

## 2015-05-28 DIAGNOSIS — Z720 Tobacco use: Secondary | ICD-10-CM | POA: Diagnosis not present

## 2015-05-28 DIAGNOSIS — C50612 Malignant neoplasm of axillary tail of left female breast: Secondary | ICD-10-CM | POA: Diagnosis not present

## 2015-05-28 DIAGNOSIS — C50919 Malignant neoplasm of unspecified site of unspecified female breast: Secondary | ICD-10-CM

## 2015-05-28 DIAGNOSIS — F17200 Nicotine dependence, unspecified, uncomplicated: Secondary | ICD-10-CM

## 2015-05-28 DIAGNOSIS — C50212 Malignant neoplasm of upper-inner quadrant of left female breast: Secondary | ICD-10-CM

## 2015-05-28 LAB — COMPREHENSIVE METABOLIC PANEL (CC13)
ALT: 17 U/L (ref 0–55)
ANION GAP: 8 meq/L (ref 3–11)
AST: 23 U/L (ref 5–34)
Albumin: 4.3 g/dL (ref 3.5–5.0)
Alkaline Phosphatase: 78 U/L (ref 40–150)
BUN: 9.1 mg/dL (ref 7.0–26.0)
CO2: 28 meq/L (ref 22–29)
Calcium: 9.8 mg/dL (ref 8.4–10.4)
Chloride: 106 mEq/L (ref 98–109)
Creatinine: 0.8 mg/dL (ref 0.6–1.1)
GLUCOSE: 104 mg/dL (ref 70–140)
Potassium: 3.9 mEq/L (ref 3.5–5.1)
SODIUM: 142 meq/L (ref 136–145)
Total Bilirubin: 0.73 mg/dL (ref 0.20–1.20)
Total Protein: 7.9 g/dL (ref 6.4–8.3)

## 2015-05-28 LAB — CBC WITH DIFFERENTIAL/PLATELET
BASO%: 1.8 % (ref 0.0–2.0)
Basophils Absolute: 0.1 10*3/uL (ref 0.0–0.1)
EOS ABS: 0.2 10*3/uL (ref 0.0–0.5)
EOS%: 4.1 % (ref 0.0–7.0)
HCT: 40.7 % (ref 34.8–46.6)
HEMOGLOBIN: 13.4 g/dL (ref 11.6–15.9)
LYMPH%: 29.7 % (ref 14.0–49.7)
MCH: 29 pg (ref 25.1–34.0)
MCHC: 32.9 g/dL (ref 31.5–36.0)
MCV: 88 fL (ref 79.5–101.0)
MONO#: 0.4 10*3/uL (ref 0.1–0.9)
MONO%: 7.3 % (ref 0.0–14.0)
NEUT#: 2.8 10*3/uL (ref 1.5–6.5)
NEUT%: 57.1 % (ref 38.4–76.8)
PLATELETS: 291 10*3/uL (ref 145–400)
RBC: 4.63 10*6/uL (ref 3.70–5.45)
RDW: 13.5 % (ref 11.2–14.5)
WBC: 4.9 10*3/uL (ref 3.9–10.3)
lymph#: 1.5 10*3/uL (ref 0.9–3.3)

## 2015-05-28 NOTE — Progress Notes (Signed)
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 She didn't think it was worth bringing to medical attention. When she went for routine gynecologic followup under [name] NP, this was palpated in the patient was set up for bilateral diagnostic mammography 10/23/2013 at [facility]. She was found to have breast density category C. A new irregular mass was noted in the left axillary tail. Ultrasound was obtained the same day and showed a 1.4 cm lobulated mass in the left breast at the 2:00 position. Biopsy of this mass 11/06/2013 showed ([id number]) an invasive ductal carcinoma, grade 2 or 3, estrogen receptor 80% positive, with moderate staining intensity; progesterone receptor 90% positive with strong staining intensity; with an MIB-1 of 10% and no HER-2 amplification (these signals ratio was 1.54 in the copy number per cell was 2.00).  On 11/13/2013 the patient underwent bilateral breast MRI. This showed a 1.3 cm irregular enhancing mass in the upper outer quadrant of the left breast (axillary tail). There were no other masses of concern in either breast and no abnormal appearing lymph nodes.  The patient's subsequent history is as detailed below.  INTERVAL HISTORY: Jessica [name] returns today for followup of her breast cancer.  She was given a prescription for tamoxifen at her last visit, but never actually started the drug. She cites potential side effects as her main concern, particularly blood clots given her  smoking history. She continues to smoke today, has given some thought to quitting, but not actually done so. The interval history is otherwise unremarkable.  REVIEW OF SYSTEMS: Jessica [name] denies fevers, chills, nausea, vomiting, or changes in bowel or bladder habits. She is eating and drinking well. She has occasional sinus headaches, but denies dizziness, or vision changes. She denies pain. She has no shortness of breath, chest pain, cough, or palpitations. A detailed review of systems is otherwise stable.   PAST MEDICAL HISTORY: Past Medical History  Diagnosis Date  . Cancer     BREAST CANCER 1990'S  . Thyroid nodule     BENIGN-DR CORNELL  . Hypertension     DR. KINGSLEY  . Anxiety   . Depression   . Vitamin D deficiency   . Smoker   . Osteoporosis 02/2014    T score -3.1 AP spine  . Hyperlipidemia   . GERD (gastroesophageal reflux disease)   . Hot flashes   . Breast cancer 11/06/13    left  . Hx of radiation therapy 01/17/14- 02/28/14    left breast 5000 cGy in 25 sessions, left breast boost 1000 cGy in 5 sessions  . Family history of colon cancer   . Genetic testing     PAST SURGICAL HISTORY: Past Surgical History  Procedure Laterality Date  . Arm surgery      AT AGE 49-PLATE INSERTED-DUE TO FALL-lt  . Gynecologic cryosurgery  1980    CYTOTHERAPY OF CERVIX  . Breast surgery  1994    rt lump  . Tubal ligation    . Biopsy thyroid    . Breast lumpectomy with needle localization and axillary  sentinel lymph node bx Left 12/11/2013    Procedure: BREAST LUMPECTOMY WITH NEEDLE LOCALIZATION AND AXILLARY SENTINEL LYMPH NODE BX;  Surgeon: [name], MD;  Location: [facility];  Service: General;  Laterality: Left;    FAMILY HISTORY Family History  Problem Relation Age of Onset  . Colon cancer Mother 33    deceased 27  . Stomach cancer Sister     Dx and died in 80s; maternal half-sister  . Heart disease Brother   . Throat cancer Father     Dx and died in  42s; smoker  . Colon cancer Sister 1    currently 74; maternal half-sister   the patient's father died in his mid 67s with cancer of the throat. The patient's mother died at the age of 34 with colon cancer. The patient had 4 brothers and 5 sisters. One sister was diagnosed with stomach cancer but the patient does not know at what age. One sister was diagnosed with colon cancer at age 73. There is no history of breast or ovarian cancer in the family to the patient's knowledge.  GYNECOLOGIC HISTORY:  Menarche age 45, first live birth age 54, the patient is [place] P4. She thinks she went through menopause approximately age 67. She did not take hormone replacement.  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 Her (second) husband [name] (goes by [name]) is retired from the [employer]. He works part-time as a Horticulturist, commercial. Daughter [name] lives in [place] where she works as a Product/process development scientist. Daughter [name] lives in [place] where she works as a Cabin crew. Daughter [name] works as a Marine scientist in [employer] here in [place]. Daughter [name] works as a Emergency planning/management officer in [place]. The patient has 7 grandchildren. She is a Psychologist, forensic.     ADVANCED DIRECTIVES: In place; she has named her daughter [name] as healthcare power of attorney   HEALTH MAINTENANCE: History  Substance Use Topics  . Smoking status: Current Every Day Smoker -- 0.50 packs/day for 43 years    Types: Cigarettes  . Smokeless tobacco: Never Used     Comment: 01/29/14 decreased to 3 cigarettes daily  . Alcohol Use: 0.6 oz/week    1 Glasses of wine per week     Colonoscopy:2005  PAP:2014  Bone density:Remote  Lipid panel:  Allergies  Allergen Reactions  . Aspirin Nausea And Vomiting  . Effexor [Venlafaxine Hydrochloride] Other (See Comments)    Sees things   . Ivp Dye [Iodinated Diagnostic Agents]     Happened 1970s per pt-sob  .  Penicillins     REACTION: hives/tongue swelled    Current Outpatient Prescriptions  Medication Sig Dispense Refill  . alendronate (FOSAMAX) 70 MG tablet Take one tablet weekly with full glass of water, 4 tablet 11  . amLODipine (NORVASC) 5 MG tablet Take 5 mg by mouth daily.      . Biotin (PA BIOTIN) 1000 MCG tablet Take 1,000 mcg by mouth 3 (three) times daily.    Marland Kitchen emollient (BIAFINE) cream Apply topically as needed.     . hydrochlorothiazide (HYDRODIURIL) 25 MG tablet Take 25 mg by mouth at bedtime.    Marland Kitchen levothyroxine (SYNTHROID, LEVOTHROID) 100 MCG tablet TAKE 1 TABLET BY MOUTH DAILY BEFORE BREAKFAST 90 tablet 3  . Multiple Vitamins-Minerals (MULTIVITAMIN WITH MINERALS) tablet Take 1 tablet by mouth daily.    Marland Kitchen EPINEPHrine (EPI-PEN) 0.3 mg/0.3 mL DEVI Inject 0.3 mLs (0.3 mg total) into  the muscle once. (Patient not taking: Reported on 05/28/2015) 2 Device 3  . PARoxetine HCl (PAXIL PO) Take 10 mg by mouth See admin instructions. Pt takes only first 2 weeks of the month     No current facility-administered medications for this visit.    OBJECTIVE: Middle-aged [place] American woman who appears stated age 64 Vitals:   05/28/15 1317  BP: 162/74  Pulse: 50  Temp: 97.6 F (36.4 C)  Resp: 18     Body mass index is 17.68 kg/(m^2).    ECOG FS:1 - Symptomatic but completely ambulatory  Skin: warm, dry  HEENT: sclerae anicteric, conjunctivae pink, oropharynx clear. No thrush or mucositis.  Lymph Nodes: No cervical or supraclavicular lymphadenopathy  Lungs: clear to auscultation bilaterally, no rales, wheezes, or rhonci  Heart: regular rate and rhythm  Abdomen: round, soft, non tender, positive bowel sounds  Musculoskeletal: No focal spinal tenderness, no peripheral edema  Neuro: non focal, well oriented, positive affect  Breasts: left breast status post lumpectomy and radiation. Minimal hyperpigmentation left from radiation. No evidence of recurrent disease. Left axilla benign. Right  breast status post remote lumpectomy. No evidence of recurrent disease. Right axilla benign.   LAB RESULTS:  CMP     Component Value Date/Time   NA 142 05/28/2015 1305   K 3.9 05/28/2015 1305   CO2 28 05/28/2015 1305   GLUCOSE 104 05/28/2015 1305   BUN 9.1 05/28/2015 1305   CREATININE 0.8 05/28/2015 1305   CALCIUM 9.8 05/28/2015 1305   PROT 7.9 05/28/2015 1305   ALBUMIN 4.3 05/28/2015 1305   AST 23 05/28/2015 1305   ALT 17 05/28/2015 1305   ALKPHOS 78 05/28/2015 1305   BILITOT 0.73 05/28/2015 1305    I No results found for: SPEP  Lab Results  Component Value Date   WBC 4.9 05/28/2015   NEUTROABS 2.8 05/28/2015   HGB 13.4 05/28/2015   HCT 40.7 05/28/2015   MCV 88.0 05/28/2015   PLT 291 05/28/2015      Chemistry      Component Value Date/Time   NA 142 05/28/2015 1305   K 3.9 05/28/2015 1305   CO2 28 05/28/2015 1305   BUN 9.1 05/28/2015 1305   CREATININE 0.8 05/28/2015 1305      Component Value Date/Time   CALCIUM 9.8 05/28/2015 1305   ALKPHOS 78 05/28/2015 1305   AST 23 05/28/2015 1305   ALT 17 05/28/2015 1305   BILITOT 0.73 05/28/2015 1305       No results found for: LABCA2  No components found for: LABCA125  No results for input(s): INR in the last 168 hours.  Urinalysis No results found for: COLORURINE  STUDIES:  No results found.   ASSESSMENT: 65 y.o. Greensb[employer] woman  (1) status post right upper inner quadrant lumpectomy 1994 for "breast cancer"; no data available; patient did not receive radiation, chemotherapy, or antiestrogen treatment  (2) status post left breast biopsy 11/06/2013 for a clinical T1c N0, stage IA invasive ductal carcinoma, grade 2, estrogen and progesterone receptor positive, HER-2 not amplified, with an MIB-1 of 10%  (3) genetics testing to be considered--(Lynch syndrome?)  (4) status post left lumpectomy and sentinel lymph node biopsy 12/11/2013 for a pT1c pN0, stage IA invasive ductal carcinoma, grade 3, repeat  HER-2 testing again negative  (5) Oncotype DX score of 16 predicts a 10 year risk of outside the breast recurrence of 10% if the patient's only systemic treatment is tamoxifen for 5 years. Is also predicts no benefit  from chemotherapy.  (6) radiation therapy completed 02/28/2014  (7) started anastrozole July 2015--  discontinued September 2015 secondary to bone aches  (8) continuing cigarette abuse--currently patient not ready to work towards quitting  (9) tamoxifen prescribed 02/19/2015 -- patient never started  PLAN: Jessica [name] has made it clear that she will not be persuaded to being antiestrogen therapy at this point, despite recognizing the risks. She is chiefly concerned about blood clots, and with her smoking habits she believes this would be a bad idea. I asked would now be a good time to consider quitting smoking, and she stated she would "pray about it."  Jessica [name] is 2 years overdue for a mammogram. Though she never missed any doctor's appointments, she states she never made an appointment for a mammogram because she was tired of doctors. She did not want the bad memories of being in that room at [facility] where she was told she had breast cancer. However, she states she is no longer in that frame of mind and was agreeable to allowing me to order a mammogram today. She prefers that this take place in August due to planned travel.   Jessica [name] will return in November for follow up with Dr. Jana Hakim. By that point she believes she will have made a solid decision about quitting smoking and going on tamoxifen. She understands and agrees with this plan. She knows the goal of treatment in her case is cure. She has been encouraged to call with any issues that might arise before her next visit here.   Laurie Panda, NP   05/28/2015 3:29 PM

## 2015-05-28 NOTE — Telephone Encounter (Signed)
Gave patient avs report and appointments for November 2016. Patient also given appointment for [facility] 06/17/2015 @ 4:30 pm.

## 2015-07-01 ENCOUNTER — Encounter: Payer: Self-pay | Admitting: Women's Health

## 2015-07-02 ENCOUNTER — Encounter: Payer: Self-pay | Admitting: Gastroenterology

## 2015-10-08 ENCOUNTER — Other Ambulatory Visit (HOSPITAL_BASED_OUTPATIENT_CLINIC_OR_DEPARTMENT_OTHER): Payer: Federal, State, Local not specified - PPO

## 2015-10-08 ENCOUNTER — Telehealth: Payer: Self-pay | Admitting: Oncology

## 2015-10-08 ENCOUNTER — Ambulatory Visit (HOSPITAL_BASED_OUTPATIENT_CLINIC_OR_DEPARTMENT_OTHER): Payer: Federal, State, Local not specified - PPO | Admitting: Oncology

## 2015-10-08 VITALS — BP 142/77 | HR 50 | Temp 98.1°F | Resp 18 | Ht 63.0 in | Wt 100.4 lb

## 2015-10-08 DIAGNOSIS — C50919 Malignant neoplasm of unspecified site of unspecified female breast: Secondary | ICD-10-CM

## 2015-10-08 DIAGNOSIS — C50412 Malignant neoplasm of upper-outer quadrant of left female breast: Secondary | ICD-10-CM

## 2015-10-08 LAB — COMPREHENSIVE METABOLIC PANEL (CC13)
ALT: 13 U/L (ref 0–55)
AST: 22 U/L (ref 5–34)
Albumin: 4.2 g/dL (ref 3.5–5.0)
Alkaline Phosphatase: 79 U/L (ref 40–150)
Anion Gap: 11 mEq/L (ref 3–11)
BILIRUBIN TOTAL: 0.74 mg/dL (ref 0.20–1.20)
BUN: 11.4 mg/dL (ref 7.0–26.0)
CO2: 26 meq/L (ref 22–29)
CREATININE: 0.8 mg/dL (ref 0.6–1.1)
Calcium: 9.9 mg/dL (ref 8.4–10.4)
Chloride: 104 mEq/L (ref 98–109)
EGFR: >90 mL/min/{1.73_m2} (ref >90)
GLUCOSE: 108 mg/dL (ref 70–140)
Potassium: 3.5 mEq/L (ref 3.5–5.1)
SODIUM: 141 meq/L (ref 136–145)
TOTAL PROTEIN: 7.8 g/dL (ref 6.4–8.3)

## 2015-10-08 LAB — CBC WITH DIFFERENTIAL/PLATELET
BASO%: 1.4 % (ref 0.0–2.0)
Basophils Absolute: 0.1 10*3/uL (ref 0.0–0.1)
EOS%: 4.6 % (ref 0.0–7.0)
Eosinophils Absolute: 0.2 10*3/uL (ref 0.0–0.5)
HCT: 41.8 % (ref 34.8–46.6)
HGB: 13.4 g/dL (ref 11.6–15.9)
LYMPH%: 32.9 % (ref 14.0–49.7)
MCH: 28.8 pg (ref 25.1–34.0)
MCHC: 32.1 g/dL (ref 31.5–36.0)
MCV: 89.9 fL (ref 79.5–101.0)
MONO#: 0.5 10*3/uL (ref 0.1–0.9)
MONO%: 8.7 % (ref 0.0–14.0)
NEUT%: 52.4 % (ref 38.4–76.8)
NEUTROS ABS: 2.7 10*3/uL (ref 1.5–6.5)
Platelets: 265 10*3/uL (ref 145–400)
RBC: 4.65 10*6/uL (ref 3.70–5.45)
RDW: 13.2 % (ref 11.2–14.5)
WBC: 5.2 10*3/uL (ref 3.9–10.3)
lymph#: 1.7 10*3/uL (ref 0.9–3.3)

## 2015-10-08 NOTE — Telephone Encounter (Signed)
Appoint?
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
## 2016-01-22 ENCOUNTER — Other Ambulatory Visit (HOSPITAL_BASED_OUTPATIENT_CLINIC_OR_DEPARTMENT_OTHER): Payer: Federal, State, Local not specified - PPO

## 2016-01-22 ENCOUNTER — Ambulatory Visit (HOSPITAL_BASED_OUTPATIENT_CLINIC_OR_DEPARTMENT_OTHER): Payer: Federal, State, Local not specified - PPO | Admitting: Oncology

## 2016-01-22 ENCOUNTER — Telehealth: Payer: Self-pay | Admitting: Oncology

## 2016-01-22 VITALS — BP 128/73 | HR 49 | Temp 97.8°F | Resp 18 | Ht 63.0 in | Wt 101.9 lb

## 2016-01-22 DIAGNOSIS — C50412 Malignant neoplasm of upper-outer quadrant of left female breast: Secondary | ICD-10-CM

## 2016-01-22 DIAGNOSIS — Z720 Tobacco use: Secondary | ICD-10-CM | POA: Diagnosis not present

## 2016-01-22 DIAGNOSIS — C50919 Malignant neoplasm of unspecified site of unspecified female breast: Secondary | ICD-10-CM

## 2016-01-22 DIAGNOSIS — K5900 Constipation, unspecified: Secondary | ICD-10-CM

## 2016-01-22 LAB — COMPREHENSIVE METABOLIC PANEL
ALK PHOS: 81 U/L (ref 40–150)
ALT: 12 U/L (ref 0–55)
ANION GAP: 8 meq/L (ref 3–11)
AST: 22 U/L (ref 5–34)
Albumin: 4.2 g/dL (ref 3.5–5.0)
BUN: 10.9 mg/dL (ref 7.0–26.0)
CALCIUM: 9.6 mg/dL (ref 8.4–10.4)
CO2: 26 mEq/L (ref 22–29)
Chloride: 107 mEq/L (ref 98–109)
Creatinine: 0.9 mg/dL (ref 0.6–1.1)
EGFR: 80 mL/min/{1.73_m2} — ABNORMAL LOW (ref >90)
Glucose: 97 mg/dl (ref 70–140)
POTASSIUM: 4.2 meq/L (ref 3.5–5.1)
Sodium: 141 mEq/L (ref 136–145)
Total Bilirubin: 0.56 mg/dL (ref 0.20–1.20)
Total Protein: 7.7 g/dL (ref 6.4–8.3)

## 2016-01-22 LAB — CBC WITH DIFFERENTIAL/PLATELET
BASO%: 2.7 % — AB (ref 0.0–2.0)
Basophils Absolute: 0.2 10*3/uL — ABNORMAL HIGH (ref 0.0–0.1)
EOS%: 6.3 % (ref 0.0–7.0)
Eosinophils Absolute: 0.4 10*3/uL (ref 0.0–0.5)
HCT: 40.8 % (ref 34.8–46.6)
HGB: 13.3 g/dL (ref 11.6–15.9)
LYMPH%: 34.1 % (ref 14.0–49.7)
MCH: 29.1 pg (ref 25.1–34.0)
MCHC: 32.5 g/dL (ref 31.5–36.0)
MCV: 89.3 fL (ref 79.5–101.0)
MONO#: 0.5 10*3/uL (ref 0.1–0.9)
MONO%: 8.3 % (ref 0.0–14.0)
NEUT%: 48.6 % (ref 38.4–76.8)
NEUTROS ABS: 2.8 10*3/uL (ref 1.5–6.5)
Platelets: 249 10*3/uL (ref 145–400)
RBC: 4.57 10*6/uL (ref 3.70–5.45)
RDW: 13.4 % (ref 11.2–14.5)
WBC: 5.8 10*3/uL (ref 3.9–10.3)
lymph#: 2 10*3/uL (ref 0.9–3.3)

## 2016-01-22 NOTE — Telephone Encounter (Signed)
appt made and avs printed °

## 2016-01-22 NOTE — Progress Notes (Signed)
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 She didn't think it was worth bringing to medical attention. When she went for routine gynecologic followup under Jessica Alas NP, this was palpated in the patient was set up for bilateral diagnostic mammography 10/23/2013 at [facility]. She was found to have breast density category C. A new irregular mass was noted in the left axillary tail. Ultrasound was obtained the same day and showed a 1.4 cm lobulated mass in the left breast at the 2:00 position. Biopsy of this mass 11/06/2013 showed ([id number]) an invasive ductal carcinoma, grade 2 or 3, estrogen receptor 80% positive, with moderate staining intensity; progesterone receptor 90% positive with strong staining intensity; with an MIB-1 of 10% and no HER-2 amplification (these signals ratio was 1.54 in the copy number per cell was 2.00).  On 11/13/2013 the patient underwent bilateral breast MRI. This showed a 1.3 cm irregular enhancing mass in the upper outer quadrant of the left breast (axillary tail). There were no other masses of concern in either breast and no abnormal appearing lymph nodes.  The patient's subsequent history is as detailed below.  INTERVAL HISTORY: Jessica [name] returns today for followup of her early stage breast cancer. She never has started tamoxifen and although she has never said she never with, she has no plans to started at present.  REVIEW OF SYSTEMS: Jessica [name] has worked through the more acute face grief from  the murder of her grandson and nodal he is working full time but also exercising regularly and doing Sales executive and going to [employer]. She would like to gain a few pounds. She describes herself is mildly fatigued. Occasional she gets leg cramps. She has a little bit of sinus allergies ringing in her ears runny nose and occasionally a mild dry cough. She tells me this usually happens to her around this time of year. She has had moderate constipation. She bruises easily. She has aches and pains here and there which are not more intense or persistent than before. She feels anxious but not depressed. She has mild hot flashes. A detailed review of systems today was otherwise stable  PAST MEDICAL HISTORY: Past Medical History  Diagnosis Date  . Cancer     BREAST CANCER 1990'S  . Thyroid nodule     BENIGN-DR CORNELL  . Hypertension     DR. KINGSLEY  . Anxiety   . Depression   . Vitamin D deficiency   . Smoker   . Osteoporosis 02/2014    T score -3.1 AP spine  . Hyperlipidemia   . GERD (gastroesophageal reflux disease)   . Hot flashes   . Breast cancer 11/06/13    left  . Hx of radiation therapy 01/17/14- 02/28/14    left breast 5000 cGy in 25 sessions, left breast boost 1000 cGy in 5 sessions  . Family history of colon cancer   . Genetic testing     PAST SURGICAL HISTORY: Past Surgical History  Procedure Laterality Date  . Arm surgery      AT AGE 63-PLATE INSERTED-DUE TO  FALL-lt  . Gynecologic cryosurgery  1980    CYTOTHERAPY OF CERVIX  . Breast surgery  1994    rt lump  . Tubal ligation    . Biopsy thyroid    . Breast lumpectomy with needle localization and axillary sentinel lymph node bx Left 12/11/2013    Procedure: BREAST LUMPECTOMY WITH NEEDLE LOCALIZATION AND AXILLARY SENTINEL LYMPH NODE BX;  Surgeon: Jessica Bookbinder, MD;  Location: [facility];  Service: General;  Laterality: Left;    FAMILY HISTORY Family History  Problem Relation Age of Onset  . Colon  cancer Mother 44    deceased 60  . Stomach cancer Sister     Dx and died in 40s; maternal half-sister  . Heart disease Brother   . Throat cancer Father     Dx and died in 32s; smoker  . Colon cancer Sister 90    currently 9; maternal half-sister   the patient's father died in his mid 35s with cancer of the throat. The patient's mother died at the age of 88 with colon cancer. The patient had 4 brothers and 5 sisters. One sister was diagnosed with stomach cancer but the patient does not know at what age. One sister was diagnosed with colon cancer at age 40. There is no history of breast or ovarian cancer in the family to the patient's knowledge.  GYNECOLOGIC HISTORY:  Menarche age 4, first live birth age 16, the patient is [name] P4. She thinks she went through menopause approximately age 65. She did not take hormone replacement.  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 Her (second) husband Jessica [name] (goes by Jessica [name]) is retired from the [employer]. He works part-time as a Horticulturist, commercial. Daughter Jessica [name] lives in [place] where she works as a Product/process development scientist. Daughter Jessica [name] lives in [place] where she works as a Cabin crew. Daughter Jessica [name] works as a Marine scientist in [employer] here in [place]. Daughter Jessica [name] works as a Emergency planning/management officer in [place]. The patient has 7 grandchildren. She is a Psychologist, forensic.     ADVANCED DIRECTIVES: In place; she has named her daughter Jessica [name] as healthcare power of attorney   HEALTH MAINTENANCE: Social History  Substance Use Topics  . Smoking status: Current Every Day Smoker -- 0.50 packs/day for 43 years    Types: Cigarettes  . Smokeless tobacco: Never Used     Comment: 01/29/14 decreased to 3 cigarettes daily  . Alcohol Use: 0.6 oz/week    1 Glasses of wine per week     Colonoscopy:2005  PAP:2014  Bone density:Remote  Lipid panel:  Allergies  Allergen Reactions  . Aspirin  Nausea And Vomiting  . Effexor [Venlafaxine Hydrochloride] Other (See Comments)    Sees things   . Ivp Dye [Iodinated Diagnostic Agents]     Happened 1970s per pt-sob  . Penicillins     REACTION: hives/tongue swelled    Current Outpatient Prescriptions  Medication Sig Dispense Refill  . alendronate (FOSAMAX) 70 MG tablet Take one tablet weekly with full glass of water, 4 tablet 11  . amLODipine (NORVASC) 5 MG tablet Take 5 mg by mouth daily.      . Biotin (PA BIOTIN) 1000 MCG tablet Take 1,000 mcg by mouth 3 (three) times daily.    Marland Kitchen emollient (BIAFINE) cream Apply topically as needed.     . hydrochlorothiazide (HYDRODIURIL) 25 MG tablet Take 25 mg by mouth at bedtime.    Marland Kitchen levothyroxine (SYNTHROID,  LEVOTHROID) 100 MCG tablet TAKE 1 TABLET BY MOUTH DAILY BEFORE BREAKFAST 90 tablet 3  . Multiple Vitamins-Minerals (MULTIVITAMIN WITH MINERALS) tablet Take 1 tablet by mouth daily.    Marland Kitchen PARoxetine HCl (PAXIL PO) Take 10 mg by mouth See admin instructions. Pt takes only first 2 weeks of the month     No current facility-administered medications for this visit.    OBJECTIVE: Middle-aged [place] American woman Who appears stated age  9 Vitals:   01/22/16 1610  BP: 128/73  Pulse: 49  Temp: 97.8 F (36.6 C)  Resp: 18     Body mass index is 18.06 kg/(m^2).    ECOG FS:0 - Asymptomatic  Sclerae unicteric, EOMs intact Oropharynx clear, dentition in good repair No cervical or supraclavicular adenopathy Lungs no rales or rhonchi Heart regular rate and rhythm Abd soft, nontender, positive bowel sounds MSK no focal spinal tenderness, no upper extremity lymphedema Neuro: nonfocal, well oriented, appropriate affect Breasts: Status post bilateral lumpectomies with implants in place. There is no evidence of local recurrence. Both axillae are benign.  LAB RESULTS:  CMP     Component Value Date/Time   NA 141 01/22/2016 1532   K 4.2 01/22/2016 1532   CO2 26 01/22/2016 1532   GLUCOSE 97  01/22/2016 1532   BUN 10.9 01/22/2016 1532   CREATININE 0.9 01/22/2016 1532   CALCIUM 9.6 01/22/2016 1532   PROT 7.7 01/22/2016 1532   ALBUMIN 4.2 01/22/2016 1532   AST 22 01/22/2016 1532   ALT 12 01/22/2016 1532   ALKPHOS 81 01/22/2016 1532   BILITOT 0.56 01/22/2016 1532    I No results found for: SPEP  Lab Results  Component Value Date   WBC 5.8 01/22/2016   NEUTROABS 2.8 01/22/2016   HGB 13.3 01/22/2016   HCT 40.8 01/22/2016   MCV 89.3 01/22/2016   PLT 249 01/22/2016      Chemistry      Component Value Date/Time   NA 141 01/22/2016 1532   K 4.2 01/22/2016 1532   CO2 26 01/22/2016 1532   BUN 10.9 01/22/2016 1532   CREATININE 0.9 01/22/2016 1532      Component Value Date/Time   CALCIUM 9.6 01/22/2016 1532   ALKPHOS 81 01/22/2016 1532   AST 22 01/22/2016 1532   ALT 12 01/22/2016 1532   BILITOT 0.56 01/22/2016 1532       No results found for: LABCA2  No components found for: LABCA125  No results for input(s): INR in the last 168 hours.  Urinalysis No results found for: COLORURINE  STUDIES: No results found.   ASSESSMENT: 65 y.o. [place] woman  (1) status post right upper inner quadrant lumpectomy 1994 for "breast cancer"; no data available; patient did not receive radiation, chemotherapy, or antiestrogen treatment  (2) status post left breast biopsy 11/06/2013 for a clinical T1c N0, stage IA invasive ductal carcinoma, grade 2, estrogen and progesterone receptor positive, HER-2 not amplified, with an MIB-1 of 10%  (3) genetics testing We recommended you pursue genetic testing of the genes on the CancerNext gene panel. Your test, which included sequencing and deletion/duplication analysis, was performed at [facility]. Testing did not reveal any clearly harmful mutations in any of these genes. The genes on the panel were APC, ATM, BARD1, BRCA1, BRCA2, BRIP1, BMPR1A, CDH1, CDK4, CDKN2A, CHEK2, EPCAM, GREM1, MLH1, MRE11A, MSH2, MSH6, MUTYH, NBN, NF1,  PALB2, PMS2, POLD  (4) status post left lumpectomy and sentinel lymph node biopsy 12/11/2013 for a pT1c pN0, stage IA invasive  ductal carcinoma, grade 3, repeat HER-2 testing again negative  (5) Oncotype DX score of 16 predicts a 10 year risk of outside the breast recurrence of 10% if the patient's only systemic treatment is tamoxifen for 5 years. Is also predicts no benefit from chemotherapy.  (6) radiation therapy completed 02/28/2014  (7) started anastrozole July 2015--  discontinued September 2015 secondary to bone aches  (8) cigarette abuse--stopped smoking August 2016  (9) tamoxifen prescribed 02/19/2015 -- patient never started it   PLAN: Jessica [name] is now 2 years out from her left lumpectomy with no evidence of disease recurrence. This is very favorable.  We again discussed anti-estrogens. She understands that they would significantly reduce her risk of recurrence, but they are inexpensive, and that they are frequently very well-tolerated. She just "hates to take pills". At this point I don't think she will ever agreed to started.  She is also not taking her Synthroid. I think her constipation and some of the problems she is having with her hair may well be due to hypothyroidism. She requested an appointment with Dr.Cornettwho she hopes will remove her goiter. She had an appointment with him at the time when her grandson was murdered last year so she was not able to keep that  She has some difficulty getting through our phone system. She met with our new office manager today and I expect we will be able to make it easy for her in other patients in the future.  She will see me again in one year. She knows to call for any problems that may develop before that visit    Chauncey Cruel, MD   01/22/2016 4:18 PM

## 2016-02-26 ENCOUNTER — Encounter: Payer: Self-pay | Admitting: Gastroenterology

## 2016-06-16 ENCOUNTER — Encounter: Payer: Self-pay | Admitting: Oncology

## 2016-06-20 ENCOUNTER — Other Ambulatory Visit: Payer: Self-pay | Admitting: Family Medicine

## 2016-06-20 DIAGNOSIS — E042 Nontoxic multinodular goiter: Secondary | ICD-10-CM

## 2016-07-03 ENCOUNTER — Ambulatory Visit
Admission: RE | Admit: 2016-07-03 | Discharge: 2016-07-03 | Disposition: A | Discharge status: Home / Self Care | Payer: Federal, State, Local not specified - PPO | Source: Ambulatory Visit | Attending: Family Medicine | Admitting: Family Medicine

## 2016-07-03 DIAGNOSIS — E042 Nontoxic multinodular goiter: Secondary | ICD-10-CM

## 2016-07-16 ENCOUNTER — Ambulatory Visit (INDEPENDENT_AMBULATORY_CARE_PROVIDER_SITE_OTHER): Payer: Federal, State, Local not specified - PPO | Admitting: Women's Health

## 2016-07-16 ENCOUNTER — Encounter: Payer: Self-pay | Admitting: Women's Health

## 2016-07-16 VITALS — BP 119/75 | Ht 63.0 in | Wt 101.8 lb

## 2016-07-16 DIAGNOSIS — Z01419 Encounter for gynecological examination (general) (routine) without abnormal findings: Secondary | ICD-10-CM | POA: Diagnosis not present

## 2016-07-16 DIAGNOSIS — M810 Age-related osteoporosis without current pathological fracture: Secondary | ICD-10-CM

## 2016-07-16 DIAGNOSIS — A499 Bacterial infection, unspecified: Secondary | ICD-10-CM

## 2016-07-16 DIAGNOSIS — N939 Abnormal uterine and vaginal bleeding, unspecified: Secondary | ICD-10-CM | POA: Diagnosis not present

## 2016-07-16 DIAGNOSIS — B9689 Other specified bacterial agents as the cause of diseases classified elsewhere: Secondary | ICD-10-CM

## 2016-07-16 DIAGNOSIS — N898 Other specified noninflammatory disorders of vagina: Secondary | ICD-10-CM | POA: Diagnosis not present

## 2016-07-16 DIAGNOSIS — N950 Postmenopausal bleeding: Secondary | ICD-10-CM

## 2016-07-16 DIAGNOSIS — N760 Acute vaginitis: Secondary | ICD-10-CM | POA: Diagnosis not present

## 2016-07-16 LAB — WET PREP FOR TRICH, YEAST, CLUE
TRICH WET PREP: NONE SEEN
Yeast Wet Prep HPF POC: NONE SEEN

## 2016-07-16 MED ORDER — ALENDRONATE SODIUM 70 MG PO TABS: ORAL_TABLET | ORAL | 11 refills | Status: DC

## 2016-07-16 MED ORDER — METRONIDAZOLE 0.75 % VA GEL: VAGINAL | 0 refills | Status: DC

## 2016-07-16 NOTE — Patient Instructions (Addendum)
Menopause is a normal process in which your reproductive ability comes to an end. This process happens gradually over a span of months to years, usually between the ages of 48 and 55. Menopause is complete when you have missed 12 consecutive menstrual periods. It is important to talk with your health care provider about some of the most common conditions that affect postmenopausal women, such as heart disease, cancer, and bone loss (osteoporosis). Adopting a healthy lifestyle and getting preventive care can help to promote your health and wellness. Those actions can also lower your chances of developing some of these common conditions. WHAT SHOULD I KNOW ABOUT MENOPAUSE? During menopause, you may experience a number of symptoms, such as:  Moderate-to-severe hot flashes.  Night sweats.  Decrease in sex drive.  Mood swings.  Headaches.  Tiredness.  Irritability.  Memory problems.  Insomnia. Choosing to treat or not to treat menopausal changes is an individual decision that you make with your health care provider. WHAT SHOULD I KNOW ABOUT HORMONE REPLACEMENT THERAPY AND SUPPLEMENTS? Hormone therapy products are effective for treating symptoms that are associated with menopause, such as hot flashes and night sweats. Hormone replacement carries certain risks, especially as you become older. If you are thinking about using estrogen or estrogen with progestin treatments, discuss the benefits and risks with your health care provider. WHAT SHOULD I KNOW ABOUT HEART DISEASE AND STROKE? Heart disease, heart attack, and stroke become more likely as you age. This may be due, in part, to the hormonal changes that your body experiences during menopause. These can affect how your body processes dietary fats, triglycerides, and cholesterol. Heart attack and stroke are both medical emergencies. There are many things that you can do to help prevent heart disease and stroke:  Have your blood pressure  checked at least every 1-2 years. High blood pressure causes heart disease and increases the risk of stroke.  If you are 55-79 years old, ask your health care provider if you should take aspirin to prevent a heart attack or a stroke.  Do not use any tobacco products, including cigarettes, chewing tobacco, or electronic cigarettes. If you need help quitting, ask your health care provider.  It is important to eat a healthy diet and maintain a healthy weight.  Be sure to include plenty of vegetables, fruits, low-fat dairy products, and lean protein.  Avoid eating foods that are high in solid fats, added sugars, or salt (sodium).  Get regular exercise. This is one of the most important things that you can do for your health.  Try to exercise for at least 150 minutes each week. The type of exercise that you do should increase your heart rate and make you sweat. This is known as moderate-intensity exercise.  Try to do strengthening exercises at least twice each week. Do these in addition to the moderate-intensity exercise.  Know your numbers.Ask your health care provider to check your cholesterol and your blood glucose. Continue to have your blood tested as directed by your health care provider. WHAT SHOULD I KNOW ABOUT CANCER SCREENING? There are several types of cancer. Take the following steps to reduce your risk and to catch any cancer development as early as possible. Breast Cancer  Practice breast self-awareness.  This means understanding how your breasts normally appear and feel.  It also means doing regular breast self-exams. Let your health care provider know about any changes, no matter how small.  If you are 40 or older, have a clinician do a   breast exam (clinical breast exam or CBE) every year. Depending on your age, family history, and medical history, it may be recommended that you also have a yearly breast X-ray (mammogram).  If you have a family history of breast cancer,  talk with your health care provider about genetic screening.  If you are at high risk for breast cancer, talk with your health care provider about having an MRI and a mammogram every year.  Breast cancer (BRCA) gene test is recommended for women who have family members with BRCA-related cancers. Results of the assessment will determine the need for genetic counseling and BRCA1 and for BRCA2 testing. BRCA-related cancers include these types:  Breast. This occurs in males or females.  Ovarian.  Tubal. This may also be called fallopian tube cancer.  Cancer of the abdominal or pelvic lining (peritoneal cancer).  Prostate.  Pancreatic. Cervical, Uterine, and Ovarian Cancer Your health care provider may recommend that you be screened regularly for cancer of the pelvic organs. These include your ovaries, uterus, and vagina. This screening involves a pelvic exam, which includes checking for microscopic changes to the surface of your cervix (Pap test).  For women ages 21-65, health care providers may recommend a pelvic exam and a Pap test every three years. For women ages 77-65, they may recommend the Pap test and pelvic exam, combined with testing for human papilloma virus (HPV), every five years. Some types of HPV increase your risk of cervical cancer. Testing for HPV may also be done on women of any age who have unclear Pap test results.  Other health care providers may not recommend any screening for nonpregnant women who are considered low risk for pelvic cancer and have no symptoms. Ask your health care provider if a screening pelvic exam is right for you.  If you have had past treatment for cervical cancer or a condition that could lead to cancer, you need Pap tests and screening for cancer for at least 20 years after your treatment. If Pap tests have been discontinued for you, your risk factors (such as having a new sexual partner) need to be reassessed to determine if you should start having  screenings again. Some women have medical problems that increase the chance of getting cervical cancer. In these cases, your health care provider may recommend that you have screening and Pap tests more often.  If you have a family history of uterine cancer or ovarian cancer, talk with your health care provider about genetic screening.  If you have vaginal bleeding after reaching menopause, tell your health care provider.  There are currently no reliable tests available to screen for ovarian cancer. Lung Cancer Lung cancer screening is recommended for adults 3-70 years old who are at high risk for lung cancer because of a history of smoking. A yearly low-dose CT scan of the lungs is recommended if you:  Currently smoke.  Have a history of at least 30 pack-years of smoking and you currently smoke or have quit within the past 15 years. A pack-year is smoking an average of one pack of cigarettes per day for one year. Yearly screening should:  Continue until it has been 15 years since you quit.  Stop if you develop a health problem that would prevent you from having lung cancer treatment. Colorectal Cancer  This type of cancer can be detected and can often be prevented.  Routine colorectal cancer screening usually begins at age 38 and continues through age 12.  If you have  risk factors for colon cancer, your health care provider may recommend that you be screened at an earlier age.  If you have a family history of colorectal cancer, talk with your health care provider about genetic screening.  Your health care provider may also recommend using home test kits to check for hidden blood in your stool.  A small camera at the end of a tube can be used to examine your colon directly (sigmoidoscopy or colonoscopy). This is done to check for the earliest forms of colorectal cancer.  Direct examination of the colon should be repeated every 5-10 years until age 67. However, if early forms of  precancerous polyps or small growths are found or if you have a family history or genetic risk for colorectal cancer, you may need to be screened more often. Skin Cancer  Check your skin from head to toe regularly.  Monitor any moles. Be sure to tell your health care provider:  About any new moles or changes in moles, especially if there is a change in a mole's shape or color.  If you have a mole that is larger than the size of a pencil eraser.  If any of your family members has a history of skin cancer, especially at a young age, talk with your health care provider about genetic screening.  Always use sunscreen. Apply sunscreen liberally and repeatedly throughout the day.  Whenever you are outside, protect yourself by wearing long sleeves, pants, a wide-brimmed hat, and sunglasses. WHAT SHOULD I KNOW ABOUT OSTEOPOROSIS? Osteoporosis is a condition in which bone destruction happens more quickly than new bone creation. After menopause, you may be at an increased risk for osteoporosis. To help prevent osteoporosis or the bone fractures that can happen because of osteoporosis, the following is recommended:  If you are 39-61 years old, get at least 1,000 mg of calcium and at least 600 mg of vitamin D per day.  If you are older than age 16 but younger than age 7, get at least 1,200 mg of calcium and at least 600 mg of vitamin D per day.  If you are older than age 47, get at least 1,200 mg of calcium and at least 800 mg of vitamin D per day. Smoking and excessive alcohol intake increase the risk of osteoporosis. Eat foods that are rich in calcium and vitamin D, and do weight-bearing exercises several times each week as directed by your health care provider. WHAT SHOULD I KNOW ABOUT HOW MENOPAUSE AFFECTS [name]? Depression may occur at any age, but it is more common as you become older. Common symptoms of depression include:  Low or sad mood.  Changes in sleep patterns.  Changes  in appetite or eating patterns.  Feeling an overall lack of motivation or enjoyment of activities that you previously enjoyed.  Frequent crying spells. Talk with your health care provider if you think that you are experiencing depression. WHAT SHOULD I KNOW ABOUT IMMUNIZATIONS? It is important that you get and maintain your immunizations. These include:  Tetanus, diphtheria, and pertussis (Tdap) booster vaccine.  Influenza every year before the flu season begins.  Pneumonia vaccine.  Shingles vaccine. Your health care provider may also recommend other immunizations.   This information is not intended to replace advice given to you by your health care provider. Make sure you discuss any questions you have with your health care provider.   Document Released: 12/25/2005 Document Revised: 11/23/2014 Document Reviewed: 07/05/2014 Elsevier Interactive Patient Education 2016 Else[employer]  Inc[employer]  Osteoporosis Osteoporosis is the thinning and loss of density in the bones. Osteoporosis makes the bones more brittle, fragile, and likely to break (fracture). Over time, osteoporosis can cause the bones to become so weak that they fracture after a simple fall. The bones most likely to fracture are the bones in the hip, wrist, and spine. CAUSES  The exact cause is not known. RISK FACTORS Anyone can develop osteoporosis. You may be at greater risk if you have a family history of the condition or have poor nutrition. You may also have a higher risk if you are:   Female.   47 years old or older.  A smoker.  Not physically active.   White or Asian.  Slender. SIGNS AND SYMPTOMS  A fracture might be the first sign of the disease, especially if it results from a fall or injury that would not usually cause a bone to break. Other signs and symptoms include:   Low back and neck pain.  Stooped posture.  Height loss. DIAGNOSIS  To make a diagnosis, your health care provider may:  Take a medical  history.  Perform a physical exam.  Order tests, such as:  A bone mineral density test.  A dual-energy X-ray absorptiometry test. TREATMENT  The goal of osteoporosis treatment is to strengthen your bones to reduce your risk of a fracture. Treatment may involve:  Making lifestyle changes, such as:  Eating a diet rich in calcium.  Doing weight-bearing and muscle-strengthening exercises.  Stopping tobacco use.  Limiting alcohol intake.  Taking medicine to slow the process of bone loss or to increase bone density.  Monitoring your levels of calcium and vitamin D. HOME CARE INSTRUCTIONS  Include calcium and vitamin D in your diet. Calcium is important for bone health, and vitamin D helps the body absorb calcium.  Perform weight-bearing and muscle-strengthening exercises as directed by your health care provider.  Do not use any tobacco products, including cigarettes, chewing tobacco, and electronic cigarettes. If you need help quitting, ask your health care provider.  Limit your alcohol intake.  Take medicines only as directed by your health care provider.  Keep all follow-up visits as directed by your health care provider. This is important.  Take precautions at home to lower your risk of falling, such as:  Keeping rooms well lit and clutter free.  Installing safety rails on stairs.  Using rubber mats in the bathroom and other areas that are often wet or slippery. SEEK IMMEDIATE MEDICAL CARE IF:  You fall or injure yourself.    This information is not intended to replace advice given to you by your health care provider. Make sure you discuss any questions you have with your health care provider.   Document Released: 08/12/2005 Document Revised: 11/23/2014 Document Reviewed: 04/12/2014 Elsevier Interactive Patient Education [employer].

## 2016-07-16 NOTE — Progress Notes (Addendum)
[name] [birth year] [id number]    History:    Presents for annual exam.  Postmenopausal on no HRT with 1 day of bright red spotting/bleeding on 07/15/2016. 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 BRCA negative with 1 variant of indeterminate significance. 1990 right breast lumpectomy for atypical cells without further treatment. Normal Pap history. 02/2014 T score -3.1 spine had been on [name]. Smoker. 10/2014 negative colon polyps 5 year follow-up. Hypertension, hypercholesterolemia, hypothyroidism, GERD managed by primary care. Smoker.. Benign thyroid nodule. Not sexually active.  Past medical history, past surgical history, family history and social history were all reviewed and documented in the EPIC chart. 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 Has 4 children one of them being a nurse at [name]. Poor relationship with husband, not abusive. Father of children was murdered many years ago had been abusive. Grandson murdered last year.  ROS:  A ROS was performed and pertinent positives and negatives are included.  Exam:  Vitals:   07/16/16 1613  BP: 119/75  Weight: 101 lb 12.8 oz (46.2 kg)  Height: '5\' 3"'  (1.6 m)   Body mass index is 18.03 kg/m.   General appearance:  Normal Thyroid:  Symmetrical, normal in size, without palpable masses or nodularity. Respiratory  Auscultation:  Clear without wheezing or rhonchi Cardiovascular  Auscultation:  Regular rate, without rubs, murmurs or gallops  Edema/varicosities:  Not grossly evident Abdominal  Soft,nontender, without masses, guarding or rebound.  Liver/spleen:  No organomegaly noted  Hernia:  None appreciated  Skin  Inspection:  Grossly normal   Breasts: Examined lying and sitting.     Right: Without masses, retractions, discharge or axillary adenopathy.     Left: Without masses, retractions, discharge or  axillary adenopathy. Gentitourinary   Inguinal/mons:  Normal without inguinal adenopathy  External genitalia:  Normal  BUS/Urethra/Skene's glands:  Normal  Vagina:  No visible blood, discharge with odor, wet prep positive for many clues, TNTC bacteria  Cervix:  Normal  Uterus:   normal in size, shape and contour.  Midline and mobile  Adnexa/parametria:     Rt: Without masses or tenderness.   Lt: Without masses or tenderness.  Anus and perineum: Normal  Digital rectal exam: Normal sphincter tone without palpated masses or tenderness  Assessment/Plan:  65 y.o. MBF G4 P4 for annual exam.   1 day bright red vaginal bleeding yesterday none today Postmenopausal on no HRT Bacteria vaginosis 11/2013 left breast cancer ER, PR positive HER-2 negative unable to tolerate [name] 1990 right breast lumpectomy-atypical cells no further treatment  Osteoporosis on [name] tolerating well Hypertension/-primary care manages labs and meds History of benign thyroid nodule Smoker  Plan: Repeat DEXA, [name] 70 mg by mouth weekly prescription, proper use given and reviewed. Home safety, fall prevention and importance of continuing active lifestyle dancing, exercise. Reviewed no visible brown or any discharge noted in vagina, will check ultrasound to check endometrial lining. Reviewed need for possible endometrial biopsy. [name] vaginal cream 1 applicator at bedtime 5, prescription, proper use given and reviewed. SBE's, continue annual screening mammogram. Follow-up with [name] as recommended. Aware of need to quit smoking. Pap normal with negative HR HPV 2016, new screening guidelines reviewed. UA. After review with Jessica. Phineas [name] will schedule sonohysterogram, will check ultrasound first.  Jessica [name] Jessica [name], 4:38 PM 07/16/2016

## 2016-07-17 LAB — URINALYSIS W MICROSCOPIC + REFLEX CULTURE
BACTERIA UA: NONE SEEN [HPF]
Bilirubin Urine: NEGATIVE
CRYSTALS: NONE SEEN [HPF]
Casts: NONE SEEN [LPF]
Glucose, UA: NEGATIVE
HGB URINE DIPSTICK: NEGATIVE
Ketones, ur: NEGATIVE
Leukocytes, UA: NEGATIVE
NITRITE: NEGATIVE
PROTEIN: NEGATIVE
RBC / HPF: NONE SEEN RBC/HPF (ref <=2)
SQUAMOUS EPITHELIAL / LPF: NONE SEEN [HPF] (ref <=5)
Specific Gravity, Urine: 1.019 (ref 1.001–1.035)
WBC, UA: NONE SEEN WBC/HPF (ref <=5)
YEAST: NONE SEEN [HPF]
pH: 5.5 (ref 5.0–8.0)

## 2016-07-17 NOTE — Addendum Note (Signed)
Addended by: [name] on: 07/17/2016 08:12 AM   Modules accepted: Orders

## 2016-07-21 ENCOUNTER — Telehealth: Payer: Self-pay | Admitting: *Deleted

## 2016-07-21 NOTE — Telephone Encounter (Signed)
Left message for pt to call.

## 2016-07-21 NOTE — Telephone Encounter (Signed)
-----   Message from [name], NP sent at 07/17/2016  8:13 AM EDT ----- Talk with Dr. Phineas Real concerning her 1 day of bright red spotting, he thinks best to do a sonohysterogram (instead of ultrasound ) the order is in the chart can you call her to let her know and then schedule? Thanks

## 2016-08-06 ENCOUNTER — Other Ambulatory Visit: Payer: Self-pay | Admitting: Gynecology

## 2016-08-06 DIAGNOSIS — M810 Age-related osteoporosis without current pathological fracture: Secondary | ICD-10-CM

## 2016-08-12 ENCOUNTER — Other Ambulatory Visit: Payer: Federal, State, Local not specified - PPO

## 2016-08-12 ENCOUNTER — Ambulatory Visit: Payer: Federal, State, Local not specified - PPO | Admitting: Women's Health

## 2016-08-13 NOTE — Telephone Encounter (Signed)
SHGM scheduled on 09/17/16

## 2016-08-17 ENCOUNTER — Ambulatory Visit (INDEPENDENT_AMBULATORY_CARE_PROVIDER_SITE_OTHER): Payer: Medicare Other

## 2016-08-17 DIAGNOSIS — M810 Age-related osteoporosis without current pathological fracture: Secondary | ICD-10-CM | POA: Diagnosis not present

## 2016-08-18 ENCOUNTER — Encounter: Payer: Self-pay | Admitting: Gynecology

## 2016-09-03 ENCOUNTER — Telehealth: Payer: Self-pay | Admitting: *Deleted

## 2016-09-03 NOTE — Telephone Encounter (Signed)
Per Zerita Boers at [employer] for [name] benefits Pt has Medicare A&B as primary and BCBS secondary. They pay what Medicare doesn't as long as covered benefit and SHGM is necessary and is covered.  Pt will be called. KW CMA

## 2016-09-07 ENCOUNTER — Other Ambulatory Visit: Payer: Self-pay | Admitting: Gynecology

## 2016-09-07 DIAGNOSIS — N950 Postmenopausal bleeding: Secondary | ICD-10-CM

## 2016-09-07 DIAGNOSIS — N939 Abnormal uterine and vaginal bleeding, unspecified: Secondary | ICD-10-CM

## 2016-09-17 ENCOUNTER — Ambulatory Visit (INDEPENDENT_AMBULATORY_CARE_PROVIDER_SITE_OTHER): Payer: Federal, State, Local not specified - PPO | Admitting: Gynecology

## 2016-09-17 ENCOUNTER — Ambulatory Visit (INDEPENDENT_AMBULATORY_CARE_PROVIDER_SITE_OTHER): Payer: Federal, State, Local not specified - PPO

## 2016-09-17 ENCOUNTER — Other Ambulatory Visit: Payer: Self-pay | Admitting: Gynecology

## 2016-09-17 ENCOUNTER — Encounter: Payer: Self-pay | Admitting: Gynecology

## 2016-09-17 VITALS — BP 120/76

## 2016-09-17 DIAGNOSIS — N939 Abnormal uterine and vaginal bleeding, unspecified: Secondary | ICD-10-CM

## 2016-09-17 DIAGNOSIS — N950 Postmenopausal bleeding: Secondary | ICD-10-CM

## 2016-09-17 DIAGNOSIS — D251 Intramural leiomyoma of uterus: Secondary | ICD-10-CM | POA: Diagnosis not present

## 2016-09-17 NOTE — Progress Notes (Addendum)
    Del [name] June 24, 1951 [id number]        65 y.o.  [id number] presents for sonohysterogram due to 1 day of postmenopausal spotting. Has had no bleeding since. Saw Izora Gala for her annual exam who referred her for a sonohysterogram. Patient does have a history of breast cancer. Transiently took tamoxifen but stopped due to musculoskeletal pain. No other history of postmenopausal bleeding.  Past medical history,surgical history, problem list, medications, allergies, family history and social history were all reviewed and documented in the EPIC chart.  Directed ROS with pertinent positives and negatives documented in the history of present illness/assessment and plan.  Exam: [place] assistant 120/76 General appearance:  Normal abdomen soft nontender without masses guarding rebound Pelvic external BUS vagina with atrophic changes. Cervix with atrophic changes. Uterus normal size midline mobile nontender. Adnexa without masses or tenderness.\  Ultrasound transabdominal and transvaginal shows uterus anteverted normal size and echo texture. Small myoma measuring 27 x 21 x 23 mm. Endometrial echo 1.6 mm. Right and left ovaries normal. Cul-de-sac negative.  Sonohysterogram performed, sterile technique, single-tooth tenaculum anterior lip stabilization, easy catheter introduction, good distention with no abnormalities. Endometrial sample taken noting very scant return noting catheter clearly within the endometrial cavity at the fundus.  Assessment/Plan:  65 y.o. [id number] with single episode of postmenopausal spotting. Very thin endometrium. Sonohysterogram negative for endometrial defects. Scant return on biopsy which I suspect will return inadequate. Reviewed all this with the patient. Assuming biopsy negative for inadequate then plan expectant management with follow up if any further bleeding.    Anastasio Auerbach MD, 4:50 PM 09/17/2016

## 2016-09-17 NOTE — Patient Instructions (Addendum)
Office will call you with biopsy results 

## 2017-01-18 ENCOUNTER — Other Ambulatory Visit: Payer: Self-pay | Admitting: Adult Health

## 2017-01-18 DIAGNOSIS — Z170 Estrogen receptor positive status [ER+]: Principal | ICD-10-CM

## 2017-01-18 DIAGNOSIS — C50212 Malignant neoplasm of upper-inner quadrant of left female breast: Secondary | ICD-10-CM

## 2017-01-19 ENCOUNTER — Other Ambulatory Visit (HOSPITAL_BASED_OUTPATIENT_CLINIC_OR_DEPARTMENT_OTHER): Payer: Federal, State, Local not specified - PPO

## 2017-01-19 DIAGNOSIS — C50412 Malignant neoplasm of upper-outer quadrant of left female breast: Secondary | ICD-10-CM | POA: Diagnosis not present

## 2017-01-19 DIAGNOSIS — Z170 Estrogen receptor positive status [ER+]: Principal | ICD-10-CM

## 2017-01-19 DIAGNOSIS — C50212 Malignant neoplasm of upper-inner quadrant of left female breast: Secondary | ICD-10-CM

## 2017-01-19 LAB — CBC WITH DIFFERENTIAL/PLATELET
BASO%: 0.8 % (ref 0.0–2.0)
Basophils Absolute: 0.1 10*3/uL (ref 0.0–0.1)
EOS%: 5.1 % (ref 0.0–7.0)
Eosinophils Absolute: 0.3 10*3/uL (ref 0.0–0.5)
HEMATOCRIT: 38.9 % (ref 34.8–46.6)
HEMOGLOBIN: 12.7 g/dL (ref 11.6–15.9)
LYMPH#: 2.5 10*3/uL (ref 0.9–3.3)
LYMPH%: 41.8 % (ref 14.0–49.7)
MCH: 29.1 pg (ref 25.1–34.0)
MCHC: 32.6 g/dL (ref 31.5–36.0)
MCV: 89 fL (ref 79.5–101.0)
MONO#: 0.5 10*3/uL (ref 0.1–0.9)
MONO%: 8.1 % (ref 0.0–14.0)
NEUT#: 2.6 10*3/uL (ref 1.5–6.5)
NEUT%: 44.2 % (ref 38.4–76.8)
PLATELETS: 283 10*3/uL (ref 145–400)
RBC: 4.37 10*6/uL (ref 3.70–5.45)
RDW: 13 % (ref 11.2–14.5)
WBC: 5.9 10*3/uL (ref 3.9–10.3)

## 2017-01-19 LAB — COMPREHENSIVE METABOLIC PANEL
ALBUMIN: 4.4 g/dL (ref 3.5–5.0)
ALK PHOS: 82 U/L (ref 40–150)
ALT: 16 U/L (ref 0–55)
AST: 23 U/L (ref 5–34)
Anion Gap: 9 mEq/L (ref 3–11)
BUN: 11.3 mg/dL (ref 7.0–26.0)
CALCIUM: 10.3 mg/dL (ref 8.4–10.4)
CO2: 27 mEq/L (ref 22–29)
Chloride: 104 mEq/L (ref 98–109)
Creatinine: 0.8 mg/dL (ref 0.6–1.1)
EGFR: 89 mL/min/{1.73_m2} — ABNORMAL LOW (ref >90)
Glucose: 89 mg/dl (ref 70–140)
Potassium: 3.8 mEq/L (ref 3.5–5.1)
Sodium: 140 mEq/L (ref 136–145)
TOTAL PROTEIN: 8 g/dL (ref 6.4–8.3)
Total Bilirubin: 0.53 mg/dL (ref 0.20–1.20)

## 2017-01-26 ENCOUNTER — Ambulatory Visit: Payer: Federal, State, Local not specified - PPO | Admitting: Oncology

## 2017-01-26 ENCOUNTER — Telehealth: Payer: Self-pay | Admitting: Oncology

## 2017-01-26 NOTE — Progress Notes (Deleted)
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 She didn't think it was worth bringing to medical attention. When she went for routine gynecologic followup under [name] NP, this was palpated in the patient was set up for bilateral diagnostic mammography 10/23/2013 at [facility]. She was found to have breast density category C. A new irregular mass was noted in the left axillary tail. Ultrasound was obtained the same day and showed a 1.4 cm lobulated mass in the left breast at the 2:00 position. Biopsy of this mass 11/06/2013 showed ([id number]) an invasive ductal carcinoma, grade 2 or 3, estrogen receptor 80% positive, with moderate staining intensity; progesterone receptor 90% positive with strong staining intensity; with an MIB-1 of 10% and no HER-2 amplification (these signals ratio was 1.54 in the copy number per cell was 2.00).  On 11/13/2013 the patient underwent bilateral breast MRI. This showed a 1.3 cm irregular enhancing mass in the upper outer quadrant of the left breast (axillary tail). There were no other masses of concern in either breast and no abnormal appearing lymph nodes.  The patient's subsequent history is as detailed below.  INTERVAL HISTORY: Jessica [name] returns today for followup of her early stage breast cancer. She never has started tamoxifen and although she has never said she never with, she has no plans to started at present.  REVIEW OF SYSTEMS: Jessica [name] has worked through the more acute face grief from  the murder of her grandson and nodal he is working full time but also exercising regularly and doing Sales executive and going to [employer]. She would like to gain a few pounds. She describes herself is mildly fatigued. Occasional she gets leg cramps. She has a little bit of sinus allergies ringing in her ears runny nose and occasionally a mild dry cough. She tells me this usually happens to her around this time of year. She has had moderate constipation. She bruises easily. She has aches and pains here and there which are not more intense or persistent than before. She feels anxious but not depressed. She has mild hot flashes. A detailed review of systems today was otherwise stable  PAST MEDICAL HISTORY: Past Medical History:  Diagnosis Date  . Anxiety   . Breast cancer ([place]) 11/06/13   left  . Cancer ([place])    BREAST CANCER 1990'S  . Depression   . Family history of colon cancer   . Genetic testing   . GERD (gastroesophageal reflux disease)   . Hot flashes   . Hx of radiation therapy 01/17/14- 02/28/14   left breast 5000 cGy in 25 sessions, left breast boost 1000 cGy in 5 sessions  . Hyperlipidemia   . Hypertension    DR. KINGSLEY  . Osteoporosis 08/2016   T score -2.9 AP spine statistically improved from prior DEXA. Stable and other sites.  . Smoker   . Thyroid nodule    BENIGN-DR CORNELL  . Vitamin D deficiency     PAST SURGICAL HISTORY: Past Surgical History:  Procedure Laterality Date  . ARM SURGERY     AT  AGE 4-PLATE INSERTED-DUE TO FALL-lt  . BIOPSY THYROID    . BREAST LUMPECTOMY WITH NEEDLE LOCALIZATION AND AXILLARY SENTINEL LYMPH NODE BX Left 12/11/2013   Procedure: BREAST LUMPECTOMY WITH NEEDLE LOCALIZATION AND AXILLARY SENTINEL LYMPH NODE BX;  Surgeon: [name], MD;  Location: [facility];  Service: General;  Laterality: Left;  . BREAST SURGERY  1994   rt lump  . GYNECOLOGIC CRYOSURGERY  1980   CYTOTHERAPY OF CERVIX  . TUBAL LIGATION      FAMILY  HISTORY Family History  Problem Relation Age of Onset  . Colon cancer Mother 69    deceased 8  . Stomach cancer Sister     Dx and died in 48s; maternal half-sister  . Heart disease Brother   . Throat cancer Father     Dx and died in 28s; smoker  . Colon cancer Sister 66    currently 41; maternal half-sister   the patient's father died in his mid 80s with cancer of the throat. The patient's mother died at the age of 72 with colon cancer. The patient had 4 brothers and 5 sisters. One sister was diagnosed with stomach cancer but the patient does not know at what age. One sister was diagnosed with colon cancer at age 1. There is no history of breast or ovarian cancer in the family to the patient's knowledge.  GYNECOLOGIC HISTORY:  Menarche age 6, first live birth age 48, the patient is [place] P4. She thinks she went through menopause approximately age 62. She did not take hormone replacement.  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 Her (second) husband [name] (goes by [name]) is retired from the [employer]. He works part-time as a Horticulturist, commercial. Daughter [name] lives in [place] where she works as a Product/process development scientist. Daughter [name] lives in [place] where she works as a Cabin crew. Daughter [name] works as a Marine scientist in [employer] here in [place]. Daughter [name] works as a Emergency planning/management officer in [place]. The patient has 7 grandchildren. She is a Psychologist, forensic.     ADVANCED DIRECTIVES: In place; she has named her daughter [name] as healthcare power of attorney   HEALTH MAINTENANCE: Social History  Substance Use Topics  . Smoking status: Current Every Day Smoker    Packs/day: 0.50    Years: 43.00    Types: Cigarettes  . Smokeless tobacco: Never Used     Comment: 01/29/14 decreased to 3 cigarettes daily  . Alcohol use 0.6 oz/week    1 Glasses of wine per week     Colonoscopy:2005  PAP:2014  Bone  density:Remote  Lipid panel:  Allergies  Allergen Reactions  . Aspirin Nausea And Vomiting  . Effexor [Venlafaxine Hydrochloride] Other (See Comments)    Sees things   . Ivp Dye [Iodinated Diagnostic Agents]     Happened 1970s per pt-sob  . Penicillins     REACTION: hives/tongue swelled    Current Outpatient Prescriptions  Medication Sig Dispense Refill  . alendronate (FOSAMAX) 70 MG tablet Take one tablet weekly with full glass of water, 4 tablet 11  . amLODipine (NORVASC) 5 MG tablet Take 5 mg by mouth daily.      Marland Kitchen emollient (BIAFINE) cream Apply topically as needed.     . hydrochlorothiazide (HYDRODIURIL) 25 MG tablet Take 25 mg by mouth at bedtime.    . Multiple Vitamins-Minerals (MULTIVITAMIN WITH MINERALS) tablet Take 1 tablet by mouth daily.    Marland Kitchen  PARoxetine HCl (PAXIL PO) Take 10 mg by mouth See admin instructions. Pt takes only first 2 weeks of the month     No current facility-administered medications for this visit.     OBJECTIVE: Middle-aged [place] American woman Who appears stated age  There were no vitals filed for this visit.   There is no height or weight on file to calculate BMI.    ECOG FS:0 - Asymptomatic  Sclerae unicteric, EOMs intact Oropharynx clear, dentition in good repair No cervical or supraclavicular adenopathy Lungs no rales or rhonchi Heart regular rate and rhythm Abd soft, nontender, positive bowel sounds MSK no focal spinal tenderness, no upper extremity lymphedema Neuro: nonfocal, well oriented, appropriate affect Breasts: Status post bilateral lumpectomies with implants in place. There is no evidence of local recurrence. Both axillae are benign.  LAB RESULTS:  CMP     Component Value Date/Time   NA 140 01/19/2017 1553   K 3.8 01/19/2017 1553   CO2 27 01/19/2017 1553   GLUCOSE 89 01/19/2017 1553   BUN 11.3 01/19/2017 1553   CREATININE 0.8 01/19/2017 1553   CALCIUM 10.3 01/19/2017 1553   PROT 8.0 01/19/2017 1553   ALBUMIN 4.4  01/19/2017 1553   AST 23 01/19/2017 1553   ALT 16 01/19/2017 1553   ALKPHOS 82 01/19/2017 1553   BILITOT 0.53 01/19/2017 1553    I No results found for: SPEP  Lab Results  Component Value Date   WBC 5.9 01/19/2017   NEUTROABS 2.6 01/19/2017   HGB 12.7 01/19/2017   HCT 38.9 01/19/2017   MCV 89.0 01/19/2017   PLT 283 01/19/2017      Chemistry      Component Value Date/Time   NA 140 01/19/2017 1553   K 3.8 01/19/2017 1553   CO2 27 01/19/2017 1553   BUN 11.3 01/19/2017 1553   CREATININE 0.8 01/19/2017 1553      Component Value Date/Time   CALCIUM 10.3 01/19/2017 1553   ALKPHOS 82 01/19/2017 1553   AST 23 01/19/2017 1553   ALT 16 01/19/2017 1553   BILITOT 0.53 01/19/2017 1553       No results found for: LABCA2  No components found for: LABCA125  No results for input(s): INR in the last 168 hours.  Urinalysis    Component Value Date/Time   COLORURINE YELLOW 07/16/2016 1650    STUDIES: No results found.   ASSESSMENT: 66 y.o. Greensbprp woman  (1) status post right upper inner quadrant lumpectomy 1994 for "breast cancer"; no data available; patient did not receive radiation, chemotherapy, or antiestrogen treatment  (2) status post left breast biopsy 11/06/2013 for a clinical T1c N0, stage IA invasive ductal carcinoma, grade 2, estrogen and progesterone receptor positive, HER-2 not amplified, with an MIB-1 of 10%  (3) genetics testing We recommended you pursue genetic testing of the genes on the CancerNext gene panel. Your test, which included sequencing and deletion/duplication analysis, was performed at [facility]. Testing did not reveal any clearly harmful mutations in any of these genes. The genes on the panel were APC, ATM, BARD1, BRCA1, BRCA2, BRIP1, BMPR1A, CDH1, CDK4, CDKN2A, CHEK2, EPCAM, GREM1, MLH1, MRE11A, MSH2, MSH6, MUTYH, NBN, NF1, PALB2, PMS2, POLD  (4) status post left lumpectomy and sentinel lymph node biopsy 12/11/2013 for a pT1c pN0, stage  IA invasive ductal carcinoma, grade 3, repeat HER-2 testing again negative  (5) Oncotype DX score of 16 predicts a 10 year risk of outside the breast recurrence of 10% if the patient's only systemic  treatment is tamoxifen for 5 years. Is also predicts no benefit from chemotherapy.  (6) radiation therapy completed 02/28/2014  (7) started anastrozole July 2015--  discontinued September 2015 secondary to bone aches  (8) cigarette abuse--stopped smoking August 2016  (9) tamoxifen prescribed 02/19/2015 -- patient never started it   PLAN: Jessica [name] is now 2 years out from her left lumpectomy with no evidence of disease recurrence. This is very favorable.  We again discussed anti-estrogens. She understands that they would significantly reduce her risk of recurrence, but they are inexpensive, and that they are frequently very well-tolerated. She just "hates to take pills". At this point I don't think she will ever agreed to started.  She is also not taking her Synthroid. I think her constipation and some of the problems she is having with her hair may well be due to hypothyroidism. She requested an appointment with Dr.Cornettwho she hopes will remove her goiter. She had an appointment with him at the time when her grandson was murdered last year so she was not able to keep that  She has some difficulty getting through our phone system. She met with our new office manager today and I expect we will be able to make it easy for her in other patients in the future.  She will see me again in one year. She knows to call for any problems that may develop before that visit    Gaetano Romberger C, MD   01/26/2017 10:54 AM

## 2017-01-26 NOTE — Telephone Encounter (Signed)
Patient called and said that she had the flu and was unable to make her appoinment this afternoon.  I cancelled the appointment but she needs to be rescheduled per patient's request.

## 2017-01-26 NOTE — Telephone Encounter (Signed)
Called patient to r/s MD appt with Magrinat. Patient is aware of new date and time.

## 2017-02-11 ENCOUNTER — Ambulatory Visit (HOSPITAL_BASED_OUTPATIENT_CLINIC_OR_DEPARTMENT_OTHER): Payer: Federal, State, Local not specified - PPO | Admitting: Oncology

## 2017-02-11 VITALS — BP 139/69 | HR 68 | Temp 97.9°F | Resp 18 | Ht 63.0 in | Wt 105.3 lb

## 2017-02-11 DIAGNOSIS — Z170 Estrogen receptor positive status [ER+]: Secondary | ICD-10-CM

## 2017-02-11 DIAGNOSIS — M810 Age-related osteoporosis without current pathological fracture: Secondary | ICD-10-CM

## 2017-02-11 DIAGNOSIS — C50212 Malignant neoplasm of upper-inner quadrant of left female breast: Secondary | ICD-10-CM

## 2017-02-11 NOTE — Progress Notes (Signed)
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 She didn't think it was worth bringing to medical attention. When she went for routine gynecologic followup under Jessica Alas NP, this was palpated in the patient was set up for bilateral diagnostic mammography 10/23/2013 at [facility]. She was found to have breast density category C. A new irregular mass was noted in the left axillary tail. Ultrasound was obtained the same day and showed a 1.4 cm lobulated mass in the left breast at the 2:00 position. Biopsy of this mass 11/06/2013 showed ([id number]) an invasive ductal carcinoma, grade 2 or 3, estrogen receptor 80% positive, with moderate staining intensity; progesterone receptor 90% positive with strong staining intensity; with an MIB-1 of 10% and no HER-2 amplification (these signals ratio was 1.54 in the copy number per cell was 2.00).  On 11/13/2013 the patient underwent bilateral breast MRI. This showed a 1.3 cm irregular enhancing mass in the upper outer quadrant of the left breast (axillary tail). There were no other masses of concern in either breast and no abnormal appearing lymph nodes.  The patient's subsequent history is as detailed below.  INTERVAL HISTORY: Jessica [name] returns today for follow-up of her estrogen receptor positive breast cancer. The interval history is generally unremarkable. She continues to work for the [employer] and [employer] job. She gets a lot of energy from the kids she says. 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  It is terrific that she never resumed smoking   REVIEW OF SYSTEMS: Jessica [name] has a many gym in her basement and she does treadmill's, weights, and other exercises almost every day. She is interested in getting some kind of reconstruction or perhaps prostheses since her breasts have sac. Aside from these issues a detailed review of systems today was stable  PAST MEDICAL HISTORY: Past Medical History:  Diagnosis Date  . Anxiety   . Breast cancer ([place]) 11/06/13   left  . Cancer ([place])    BREAST CANCER 1990'S  . Depression   . Family history of colon cancer   . Genetic testing   . GERD (gastroesophageal reflux disease)   . Hot flashes   . Hx of radiation therapy 01/17/14- 02/28/14   left breast 5000 cGy in 25 sessions, left breast boost 1000 cGy in 5 sessions  . Hyperlipidemia   . Hypertension    DR. KINGSLEY  . Osteoporosis 08/2016   T score -2.9 AP spine statistically improved from prior DEXA. Stable and other sites.  . Smoker   . Thyroid nodule    BENIGN-DR CORNELL  . Vitamin D deficiency     PAST SURGICAL HISTORY: Past Surgical History:  Procedure Laterality Date  . ARM SURGERY     AT AGE 51-PLATE INSERTED-DUE TO FALL-lt  . BIOPSY THYROID    . BREAST LUMPECTOMY WITH NEEDLE LOCALIZATION AND AXILLARY SENTINEL LYMPH NODE BX Left 12/11/2013   Procedure: BREAST LUMPECTOMY WITH NEEDLE LOCALIZATION AND AXILLARY SENTINEL LYMPH NODE BX;  Surgeon: Jessica Bookbinder, MD;  Location: [facility];  Service: General;  Laterality: Left;  . BREAST SURGERY  1994   rt lump  . GYNECOLOGIC CRYOSURGERY  1980   CYTOTHERAPY OF CERVIX  . TUBAL LIGATION      FAMILY HISTORY Family History  Problem Relation Age of Onset  . Colon cancer Mother 47    deceased 63  . Stomach cancer Sister     Dx and died in 58s; maternal half-sister  . Heart disease Brother   . Throat cancer Father     Dx and died  in 50s; smoker  . Colon cancer Sister 70    currently 7; maternal half-sister   the patient's father died in his mid 13s with cancer of the throat. The patient's mother died at the age of 7 with colon cancer. The patient had 4 brothers and 5 sisters. One sister was diagnosed with stomach cancer but the patient does not know at what age. One sister was diagnosed with colon cancer at age 44. There is no history of breast or ovarian cancer in the family to the patient's knowledge.  GYNECOLOGIC HISTORY:  Menarche age 74, first live birth age 19, the patient is [place] P4. She thinks she went through menopause approximately age 30. She did not take hormone replacement.  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 Her (second) husband Jessica [name] (goes by [name]) is retired from the [employer]. He works part-time as a Horticulturist, commercial. Daughter Jessica [name] lives in [place] where she works as a Product/process development scientist. Daughter Jessica [name] lives in [place] where she works as a Cabin crew. Daughter Jessica [name] works as a Marine scientist in [employer] here in [place]. Daughter Jessica [name] works as a Emergency planning/management officer in [place]. The patient has 7 grandchildren. She is a Psychologist, forensic.     ADVANCED DIRECTIVES: In place; she has named her daughter Jessica [name] as healthcare power of attorney   HEALTH MAINTENANCE: Social History  Substance Use Topics  . Smoking status: Current Every Day Smoker    Packs/day: 0.50    Years: 43.00    Types: Cigarettes  . Smokeless tobacco: Never Used     Comment: 01/29/14 decreased to 3 cigarettes daily  . Alcohol use 0.6 oz/week    1 Glasses of wine per week     Colonoscopy:2005  PAP:2014  Bone density:October 2017 showed osteoporosis  Lipid panel:  Allergies  Allergen Reactions  . Aspirin Nausea And Vomiting  . Effexor [Venlafaxine Hydrochloride] Other (See Comments)    Sees things   . Ivp Dye [Iodinated Diagnostic Agents]      Happened 1970s per pt-sob  . Penicillins     REACTION: hives/tongue swelled    Current Outpatient Prescriptions  Medication Sig Dispense Refill  . alendronate (FOSAMAX) 70 MG tablet Take one tablet weekly with full glass of water, 4 tablet 11  . amLODipine (NORVASC) 5 MG tablet Take 5 mg by mouth daily.      Marland Kitchen emollient (BIAFINE) cream Apply topically as needed.     . hydrochlorothiazide (HYDRODIURIL) 25 MG tablet Take 25 mg by mouth at bedtime.    . Multiple Vitamins-Minerals (MULTIVITAMIN WITH MINERALS) tablet Take 1 tablet by mouth daily.    Marland Kitchen PARoxetine HCl (PAXIL PO) Take 10 mg by mouth See admin instructions. Pt takes only first 2 weeks of the month     No current facility-administered medications for this visit.     OBJECTIVE: Middle-aged [place] American woman Who appears well  Vitals:   02/11/17 1506  BP: 139/69  Pulse: 68  Resp: 18  Temp: 97.9 F (36.6 C)     Body mass index is 18.65 kg/m.    ECOG FS:0 - Asymptomatic  Sclerae unicteric, pupils round and equal Oropharynx clear and moist No cervical or supraclavicular adenopathy Lungs no rales or rhonchi Heart regular rate and rhythm Abd soft, nontender, positive bowel sounds MSK no focal spinal tenderness, no upper extremity lymphedema Neuro: nonfocal, well oriented, appropriate affect Breasts: I do not palpate a mass in either breast. Both axillae are benign.   LAB RESULTS:  CMP     Component Value Date/Time   NA 140 01/19/2017 1553   K 3.8 01/19/2017 1553   CO2 27 01/19/2017 1553   GLUCOSE 89 01/19/2017 1553   BUN 11.3 01/19/2017 1553   CREATININE 0.8 01/19/2017 1553   CALCIUM 10.3 01/19/2017 1553   PROT 8.0 01/19/2017 1553   ALBUMIN 4.4 01/19/2017 1553   AST 23 01/19/2017 1553   ALT 16 01/19/2017 1553   ALKPHOS 82 01/19/2017 1553   BILITOT 0.53 01/19/2017 1553    I No results found for: SPEP  Lab Results  Component Value Date   WBC 5.9 01/19/2017   NEUTROABS 2.6 01/19/2017   HGB 12.7  01/19/2017   HCT 38.9 01/19/2017   MCV 89.0 01/19/2017   PLT 283 01/19/2017      Chemistry      Component Value Date/Time   NA 140 01/19/2017 1553   K 3.8 01/19/2017 1553   CO2 27 01/19/2017 1553   BUN 11.3 01/19/2017 1553   CREATININE 0.8 01/19/2017 1553      Component Value Date/Time   CALCIUM 10.3 01/19/2017 1553   ALKPHOS 82 01/19/2017 1553   AST 23 01/19/2017 1553   ALT 16 01/19/2017 1553   BILITOT 0.53 01/19/2017 1553       No results found for: LABCA2  No components found for: LABCA125  No results for input(s): INR in the last 168 hours.  Urinalysis    Component Value Date/Time   COLORURINE YELLOW 07/16/2016 1650    STUDIES: No results found.   ASSESSMENT: 66 y.o. Greensbprp woman  (1) status post right upper inner quadrant lumpectomy 1994 for "breast cancer"; no data available; patient did not receive radiation, chemotherapy, or antiestrogen treatment  (2) status post left breast biopsy 11/06/2013 for a clinical T1c N0, stage IA invasive ductal carcinoma, grade 2, estrogen and progesterone receptor positive, HER-2 not amplified, with an MIB-1 of 10%  (3) genetics testing We recommended you pursue genetic testing of the genes on the CancerNext gene panel. Your test, which included sequencing and deletion/duplication analysis, was performed at [facility]. Testing did not reveal any clearly harmful mutations in any of these genes. The genes on the panel were APC, ATM, BARD1, BRCA1, BRCA2, BRIP1, BMPR1A, CDH1, CDK4, CDKN2A, CHEK2, EPCAM, GREM1, MLH1, MRE11A, MSH2, MSH6, MUTYH, NBN, NF1, PALB2, PMS2, POLD  (4) status post left lumpectomy and sentinel lymph node biopsy 12/11/2013 for a pT1c pN0, stage IA invasive ductal carcinoma, grade 3, repeat HER-2 testing again negative  (5) Oncotype DX score of 16 predicts a 10 year risk of outside the breast recurrence of 10% if the patient's only systemic treatment is tamoxifen for 5 years. Is also predicts no benefit  from chemotherapy.  (6) radiation therapy completed 02/28/2014  (7) started anastrozole July 2015--  discontinued September 2015 secondary to bone aches  (8) cigarette abuse--stopped smoking August 2016  (9)  tamoxifen prescribed 02/19/2015 -- patient never started it   (10) osteoporosis on bone density scan October 2017: on alendronate  PLAN: Jessica [name] is now 3 years out from definitive surgery for her breast cancer, with no evidence of disease recurrence. This is very favorable.  She is interested in either having breast reconstruction or augmentation, or perhaps using breast prostheses. I went ahead and wrote her a prescription for per cc and bras to explore at the [facility] [employer].  I encouraged Jessica [name] to continue off cigarettes. She aerated he has optimal exercise.   She will see me again in a year. She knows to call for any problems that may develop before her return  Chauncey Cruel, MD   02/11/2017 3:57 PM

## 2017-07-21 ENCOUNTER — Other Ambulatory Visit: Payer: Self-pay | Admitting: Women's Health

## 2017-07-21 DIAGNOSIS — M810 Age-related osteoporosis without current pathological fracture: Secondary | ICD-10-CM

## 2017-07-27 ENCOUNTER — Other Ambulatory Visit: Payer: Self-pay | Admitting: Family Medicine

## 2017-07-27 DIAGNOSIS — E042 Nontoxic multinodular goiter: Secondary | ICD-10-CM

## 2017-11-27 IMAGING — US US THYROID
1 series · 13 of 25 positions shown · non-contrast
Comparison: 01/03/2015

CLINICAL DATA: Multi nodular thyroid

EXAM:
THYROID ULTRASOUND
TECHNIQUE: Ultrasound examination of the thyroid gland and adjacent soft
tissues was performed.

[Series 1: us thyroid · 0.06mm/px · 13 of 80 slices shown]
[im 1/80]
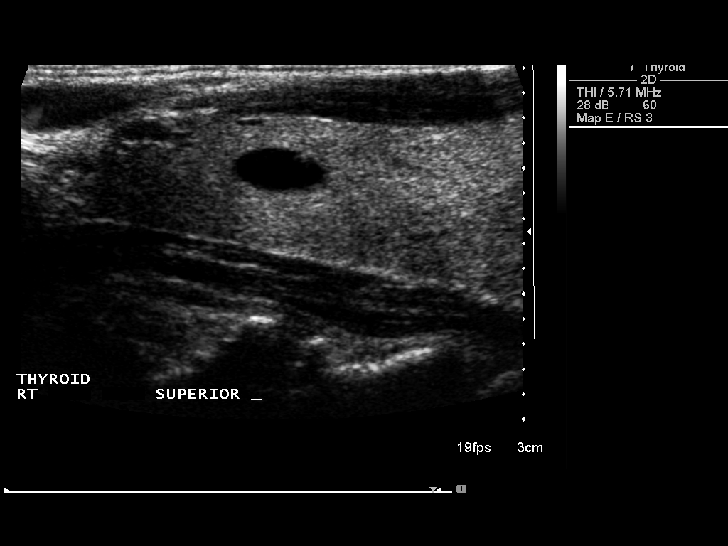
[im 7/80]
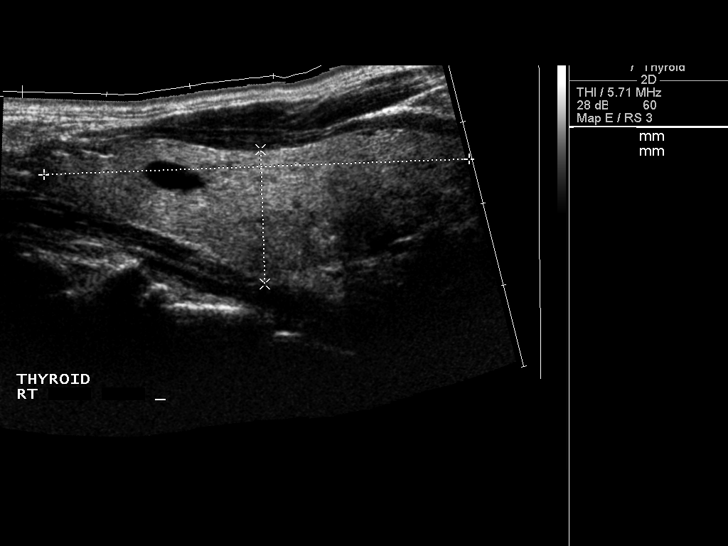
[im 14/80]
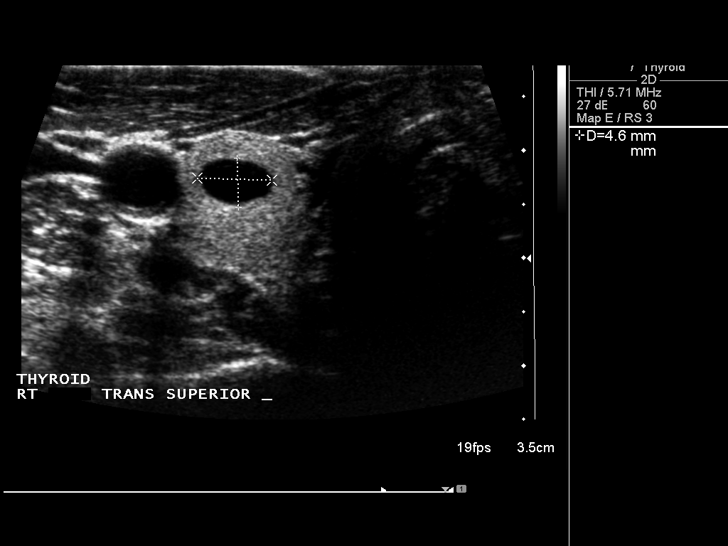
[im 20/80]
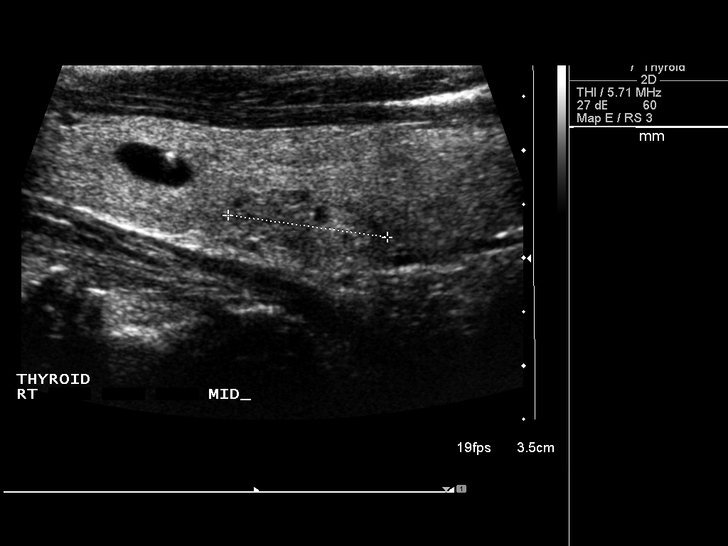
[im 27/80]
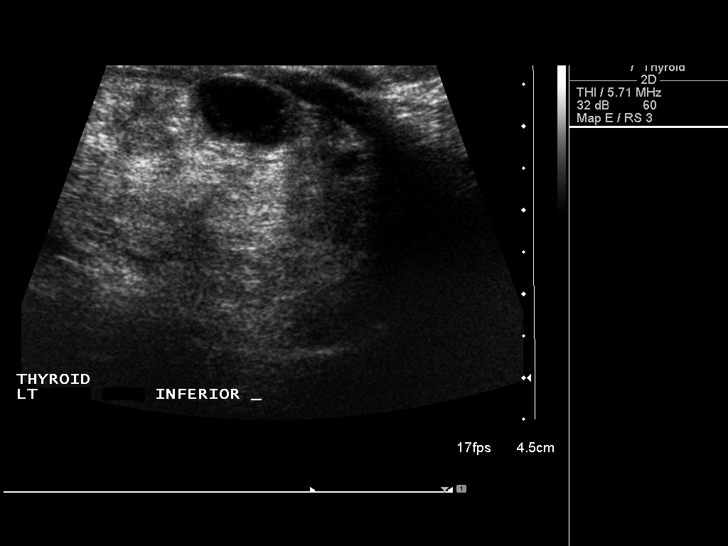
[im 33/80]
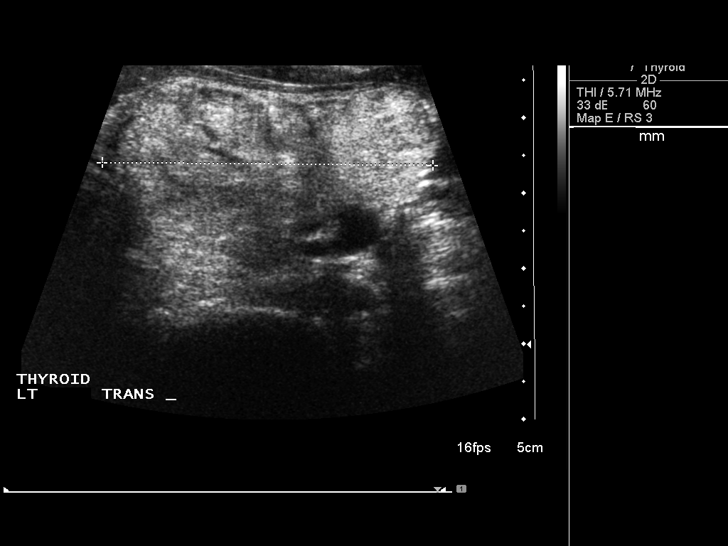
[im 40/80]
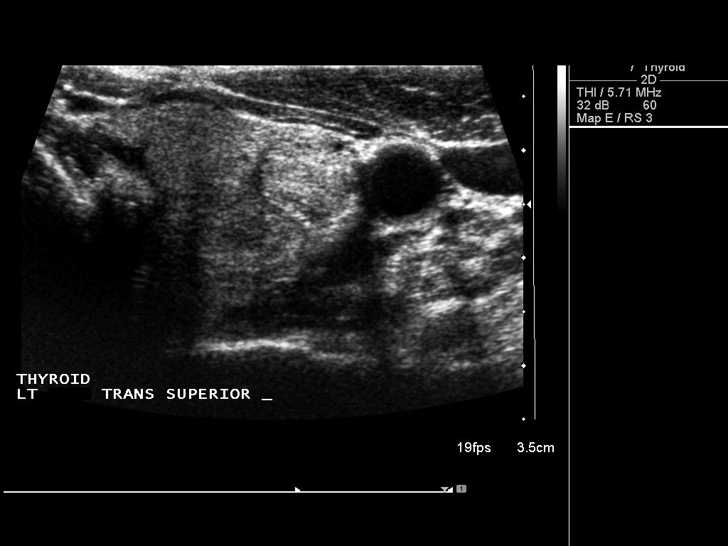
[im 47/80]
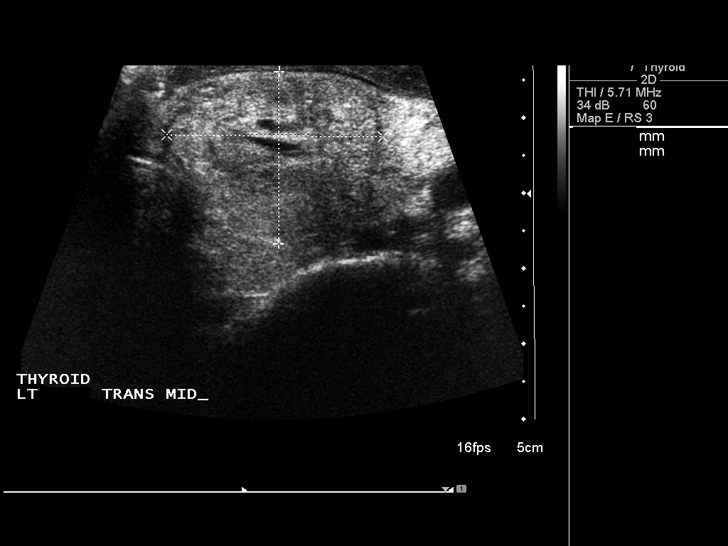
[im 53/80]
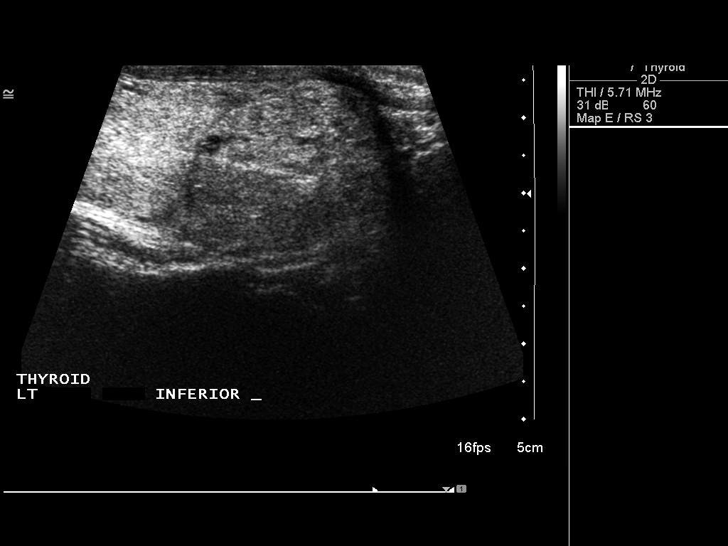
[im 60/80]
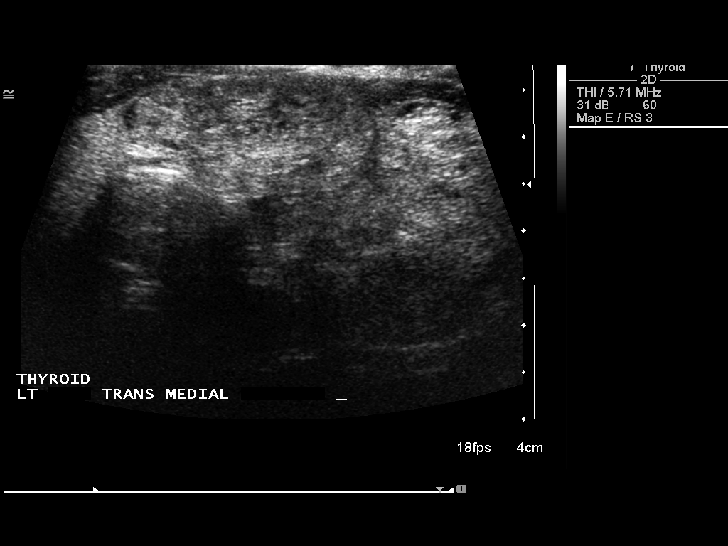
[im 66/80]
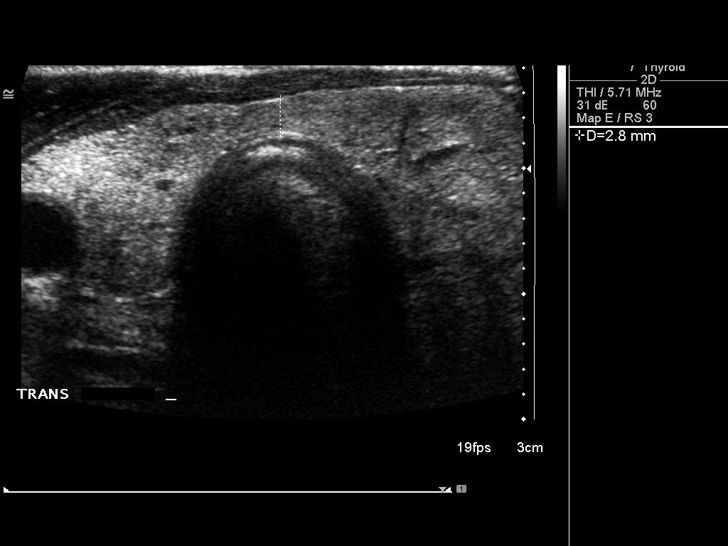
[im 73/80]
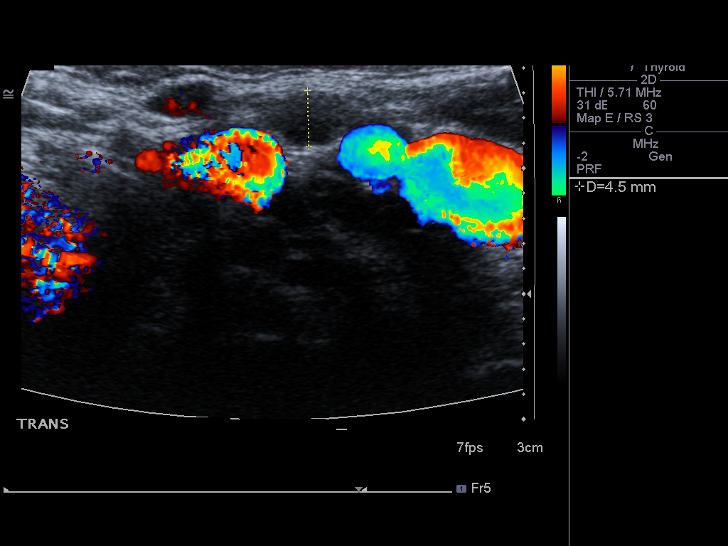
[im 80/80]
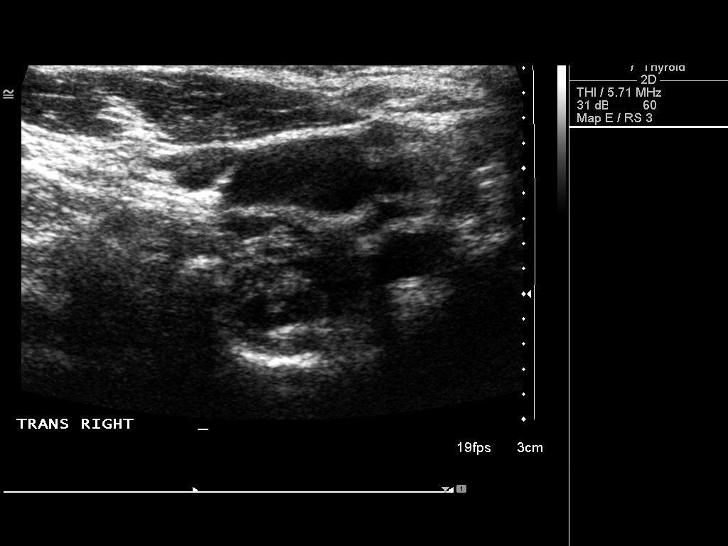

[13 of 25 positions shown; findings below may reference images not displayed]

FINDINGS: Parenchymal Echotexture: Heterogeneous

Estimated total number of nodules > 1 cm: <5

Number of spongiform nodules > 2 cm not described below (TR1): 0

Number of mixed cystic nodules > 1.5 cm not described below (TR2): 0

_________________________________________________________

Isthmus: 3 mm

Left isthmus solid echogenic nodule measures 3.8 x 1.4 x 2.8 cm,
unchanged

Right lobe: 5.1 x 1.6 x 1.9 cm

Stable 8 mm cystic nodule in the right upper pole.

Stable right midpole isoechoic nodule measuring 15 x 9 x 12 mm

Left lobe: 6.5 x 3.2 x 4.4 cm

Left lobe is replaced by numerous heterogeneous mixed echogenicity
solid nodules, all appearing stable in size. Largest stable left
midpole mixed echogenicity solid nodule measures 3.3 x 2.3 x 2.9 cm.
The other measurable nodules in the left lobe are unchanged
measuring 3.2 cm or less in size.
IMPRESSION: Stable thyroid nodules, larger on the left

The above is in keeping with the ACR TI-RADS recommendations - [HOSPITAL] 2360;[DATE].

## 2018-02-15 ENCOUNTER — Telehealth: Payer: Self-pay | Admitting: Oncology

## 2018-02-15 NOTE — Telephone Encounter (Signed)
Left message for patient regarding moving her appt per 4/2 phone msg

## 2018-02-16 ENCOUNTER — Ambulatory Visit: Payer: Federal, State, Local not specified - PPO | Admitting: Oncology

## 2018-02-16 ENCOUNTER — Other Ambulatory Visit: Payer: Federal, State, Local not specified - PPO

## 2018-02-16 ENCOUNTER — Ambulatory Visit (HOSPITAL_BASED_OUTPATIENT_CLINIC_OR_DEPARTMENT_OTHER): Payer: Federal, State, Local not specified - PPO | Admitting: Oncology

## 2018-02-16 ENCOUNTER — Encounter: Payer: Self-pay | Admitting: Oncology

## 2018-02-16 DIAGNOSIS — C50412 Malignant neoplasm of upper-outer quadrant of left female breast: Secondary | ICD-10-CM

## 2018-02-16 DIAGNOSIS — Z170 Estrogen receptor positive status [ER+]: Secondary | ICD-10-CM

## 2018-02-16 DIAGNOSIS — C50212 Malignant neoplasm of upper-inner quadrant of left female breast: Secondary | ICD-10-CM

## 2018-02-16 NOTE — Progress Notes (Signed)
No show

## 2018-04-25 NOTE — Progress Notes (Signed)
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 She didn't think it was worth bringing to medical attention. When she went for routine gynecologic followup under Jessica Alas NP, this was palpated in the patient was set up for bilateral diagnostic mammography 10/23/2013 at [facility]. She was found to have breast density category C. A new irregular mass was noted in the left axillary tail. Ultrasound was obtained the same day and showed a 1.4 cm lobulated mass in the left breast at the 2:00 position. Biopsy of this mass 11/06/2013 showed ([id number]) an invasive ductal carcinoma, grade 2 or 3, estrogen receptor 80% positive, with moderate staining intensity; progesterone receptor 90% positive with strong staining intensity; with an MIB-1 of 10% and no HER-2 amplification (these signals ratio was 1.54 in the copy number per cell was 2.00).  On 11/13/2013 the patient underwent bilateral breast MRI. This showed a 1.3 cm irregular enhancing mass in the upper outer quadrant of the left breast (axillary tail). There were no other masses of concern in either breast and no abnormal appearing lymph nodes.  The patient's subsequent history is as detailed below.  INTERVAL HISTORY: Jessica [name] returns today for follow-up of her estrogen receptor positive breast cancer. She continues under observation. She went through some family and work related stresses over the last year. She notes that she had to take a mental and spiritual vacation to gain back peace  of mind. She gained encouragement with prayer and music.   She notes that she did not follow up with any of her doctors or complete her mammogram in August 2018 because of the amount of stress she was dealing with.   She occasionally has itching and dryness under the breast from radiation treatments.   REVIEW OF SYSTEMS: Jessica [name] reports that she is taking 2000 mg of vitamin D supplements. . She continues to work 5 hours per day. She denies unusual headaches, visual changes, nausea, vomiting, or dizziness. There has been no unusual cough, phlegm production, or pleurisy. This been no change in bowel or bladder habits. She denies unexplained fatigue or unexplained weight loss, bleeding, rash, or fever. A detailed review of systems was otherwise stable.    PAST MEDICAL HISTORY: Past Medical History:  Diagnosis Date  . Anxiety   . Breast cancer ([place]) 11/06/13   left  . Cancer ([place])    BREAST CANCER 1990'S  . Depression   . Family history of colon cancer   . Genetic testing   . GERD (gastroesophageal reflux disease)   . Hot flashes   . Hx of radiation therapy 01/17/14- 02/28/14   left breast 5000 cGy in 25 sessions, left breast boost 1000 cGy in 5 sessions  . Hyperlipidemia   . Hypertension    DR. KINGSLEY  . Osteoporosis 08/2016   T score -2.9 AP spine statistically improved from prior DEXA. Stable and other sites.  . Smoker   . Thyroid nodule    BENIGN-DR CORNELL  . Vitamin D deficiency     PAST SURGICAL HISTORY: Past Surgical History:  Procedure Laterality Date  .  ARM SURGERY     AT AGE 18-PLATE INSERTED-DUE TO FALL-lt  . BIOPSY THYROID    . BREAST LUMPECTOMY WITH NEEDLE LOCALIZATION AND AXILLARY SENTINEL LYMPH NODE BX Left 12/11/2013   Procedure: BREAST LUMPECTOMY WITH NEEDLE LOCALIZATION AND AXILLARY SENTINEL LYMPH NODE BX;  Surgeon: Jessica Bookbinder, MD;  Location: [facility];  Service: General;  Laterality: Left;  . BREAST SURGERY  1994   rt lump  .  GYNECOLOGIC CRYOSURGERY  1980   CYTOTHERAPY OF CERVIX  . TUBAL LIGATION      FAMILY HISTORY Family History  Problem Relation Age of Onset  . Colon cancer Mother 30       deceased 63  . Stomach cancer Sister        Dx and died in 80s; maternal half-sister  . Heart disease Brother   . Throat cancer Father        Dx and died in 71s; smoker  . Colon cancer Sister 22       currently 30; maternal half-sister   the patient's father died in his mid 43s with cancer of the throat. The patient's mother died at the age of 62 with colon cancer. The patient had 4 brothers and 5 sisters. One sister was diagnosed with stomach cancer but the patient does not know at what age. One sister was diagnosed with colon cancer at age 28. There is no history of breast or ovarian cancer in the family to the patient's knowledge.  GYNECOLOGIC HISTORY:  Menarche age 89, first live birth age 1, the patient is [name] P4. She thinks she went through menopause approximately age 20. She did not take hormone replacement.  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 Her (second) husband Jessica [name] (goes by Jessica [name]) is retired from the [employer]. He works part-time as a Horticulturist, commercial. Daughter Jessica [name] lives in [place] where she works as a Product/process development scientist. Daughter Jessica [name] lives in [place] where she works as a Cabin crew. Daughter Jessica [name] works as a Marine scientist in [employer] here in [place]. Daughter Jessica [name] works as a Emergency planning/management officer in [place]. The patient has 7 grandchildren. She is a Psychologist, forensic.     ADVANCED DIRECTIVES: In place; she has named her daughter Jessica [name] as healthcare power of attorney   HEALTH MAINTENANCE: Social History   Tobacco Use  . Smoking status: Current Every Day Smoker    Packs/day: 0.50    Years: 43.00    Pack years: 21.50    Types: Cigarettes  . Smokeless tobacco: Never Used  . Tobacco comment: 01/29/14 decreased to 3  cigarettes daily  Substance Use Topics  . Alcohol use: Yes    Alcohol/week: 0.6 oz    Types: 1 Glasses of wine per week  . Drug use: No     Colonoscopy:2005  PAP:2014  Bone density:October 2017 showed osteoporosis  Lipid panel:  Allergies  Allergen Reactions  . Aspirin Nausea And Vomiting  . Effexor [Venlafaxine Hydrochloride] Other (See Comments)    Sees things   . Ivp Dye [Iodinated Diagnostic Agents]     Happened 1970s per pt-sob  . Penicillins     REACTION: hives/tongue swelled    Current Outpatient Medications  Medication Sig Dispense Refill  . alendronate (FOSAMAX) 70 MG tablet TAKE 1 TABLET BY MOUTH EVERY WEEK WITH FULL GLASS OF WATER 12 tablet 0  . amLODipine (NORVASC) 5 MG tablet Take 5 mg by mouth daily.      Marland Kitchen  emollient (BIAFINE) cream Apply topically as needed.     . hydrochlorothiazide (HYDRODIURIL) 25 MG tablet Take 25 mg by mouth at bedtime.    . Multiple Vitamins-Minerals (MULTIVITAMIN WITH MINERALS) tablet Take 1 tablet by mouth daily.    Marland Kitchen PARoxetine HCl (PAXIL PO) Take 10 mg by mouth See admin instructions. Pt takes only first 2 weeks of the month     No current facility-administered medications for this visit.     OBJECTIVE: Middle-aged [place] American woman in no acute distress Vitals:   04/27/18 1514  BP: (!) 143/93  Pulse: (!) 51  Resp: 18  Temp: 98.2 F (36.8 C)  SpO2: 100%     Body mass index is 18.55 kg/m.    ECOG FS:0 - Asymptomatic  Sclerae unicteric, EOMs intact Oropharynx clear and moist No cervical or supraclavicular adenopathy Lungs no rales or rhonchi Heart regular rate and rhythm Abd soft, nontender, positive bowel sounds MSK no focal spinal tenderness, no upper extremity lymphedema Neuro: nonfocal, well oriented, appropriate affect Breasts: Status post bilateral lumpectomies.  I do not palpate a mass in either breast.  Both axillae are benign.   LAB RESULTS:  CMP     Component Value Date/Time   NA 140 01/19/2017 1553    K 3.8 01/19/2017 1553   CO2 27 01/19/2017 1553   GLUCOSE 89 01/19/2017 1553   BUN 11.3 01/19/2017 1553   CREATININE 0.8 01/19/2017 1553   CALCIUM 10.3 01/19/2017 1553   PROT 8.0 01/19/2017 1553   ALBUMIN 4.4 01/19/2017 1553   AST 23 01/19/2017 1553   ALT 16 01/19/2017 1553   ALKPHOS 82 01/19/2017 1553   BILITOT 0.53 01/19/2017 1553    I No results found for: SPEP  Lab Results  Component Value Date   WBC 5.1 04/27/2018   NEUTROABS 2.7 04/27/2018   HGB 13.4 04/27/2018   HCT 40.5 04/27/2018   MCV 88.3 04/27/2018   PLT 248 04/27/2018      Chemistry      Component Value Date/Time   NA 140 01/19/2017 1553   K 3.8 01/19/2017 1553   CO2 27 01/19/2017 1553   BUN 11.3 01/19/2017 1553   CREATININE 0.8 01/19/2017 1553      Component Value Date/Time   CALCIUM 10.3 01/19/2017 1553   ALKPHOS 82 01/19/2017 1553   AST 23 01/19/2017 1553   ALT 16 01/19/2017 1553   BILITOT 0.53 01/19/2017 1553       No results found for: LABCA2  No components found for: LABCA125  No results for input(s): INR in the last 168 hours.  Urinalysis    Component Value Date/Time   COLORURINE YELLOW 07/16/2016 1650    STUDIES: Repeat mammography due at [facility] June 2019  ASSESSMENT: 67 y.o. [place] woman  (1) status post right upper inner quadrant lumpectomy 1994 for "breast cancer"; no data available; patient did not receive radiation, chemotherapy, or antiestrogen treatment  (2) status post left breast biopsy 11/06/2013 for a clinical T1c N0, stage IA invasive ductal carcinoma, grade 2, estrogen and progesterone receptor positive, HER-2 not amplified, with an MIB-1 of 10%  (3) genetics testing We recommended you pursue genetic testing of the genes on the CancerNext gene panel. Your test, which included sequencing and deletion/duplication analysis, was performed at [facility]. Testing did not reveal any clearly harmful mutations in any of these genes. The genes on the panel were APC,  ATM, BARD1, BRCA1, BRCA2, BRIP1, BMPR1A, CDH1, CDK4, CDKN2A, CHEK2, EPCAM, GREM1, MLH1, MRE11A, MSH2,  MSH6, MUTYH, NBN, NF1, PALB2, PMS2, POLD  (4) status post left lumpectomy and sentinel lymph node biopsy 12/11/2013 for a pT1c pN0, stage IA invasive ductal carcinoma, grade 3, repeat HER-2 testing again negative  (5) Oncotype DX score of 16 predicts a 10 year risk of outside the breast recurrence of 10% if the patient's only systemic treatment is tamoxifen for 5 years. Is also predicts no benefit from chemotherapy.  (6) radiation therapy completed 02/28/2014  (7) started anastrozole July 2015--  discontinued September 2015 secondary to bone aches  (8) cigarette abuse--stopped smoking August 2016  (9) tamoxifen prescribed 02/19/2015 -- patient never started it   (10) osteoporosis on bone density scan October 2017: on alendronate  PLAN: Jessica [name] is now a little over 4 years out from her most recent surgery for breast cancer with no evidence of disease recurrence.  This is very favorable.  She will have mammography this month and then she will see me again one last time July 2020.  At that time she will be ready to "graduate" from follow-up  I am delighted that she is more in control of her life.  She has decided not to undergo further reconstruction.  I think that is reasonable.  She knows to call for any issues that may develop before the next visit.   Chalice Philbert, Virgie Dad, MD  04/27/18 3:48 PM Medical Oncology and Hematology [facility] [address] [place], [place] [address] Tel. [phone]    Fax. [phone]  Alice Rieger, am acting as scribe for Chauncey Cruel MD.  I, Lurline Del MD, have reviewed the above documentation for accuracy and completeness, and I agree with the above.

## 2018-04-27 ENCOUNTER — Inpatient Hospital Stay (HOSPITAL_BASED_OUTPATIENT_CLINIC_OR_DEPARTMENT_OTHER): Payer: Federal, State, Local not specified - PPO | Admitting: Oncology

## 2018-04-27 ENCOUNTER — Telehealth: Payer: Self-pay | Admitting: Oncology

## 2018-04-27 ENCOUNTER — Inpatient Hospital Stay: Payer: Federal, State, Local not specified - PPO | Attending: Oncology

## 2018-04-27 VITALS — BP 143/93 | HR 51 | Temp 98.2°F | Resp 18 | Ht 63.0 in | Wt 104.7 lb

## 2018-04-27 DIAGNOSIS — K219 Gastro-esophageal reflux disease without esophagitis: Secondary | ICD-10-CM | POA: Diagnosis not present

## 2018-04-27 DIAGNOSIS — C50412 Malignant neoplasm of upper-outer quadrant of left female breast: Secondary | ICD-10-CM

## 2018-04-27 DIAGNOSIS — M810 Age-related osteoporosis without current pathological fracture: Secondary | ICD-10-CM

## 2018-04-27 DIAGNOSIS — Z170 Estrogen receptor positive status [ER+]: Secondary | ICD-10-CM | POA: Diagnosis not present

## 2018-04-27 DIAGNOSIS — Z803 Family history of malignant neoplasm of breast: Secondary | ICD-10-CM | POA: Insufficient documentation

## 2018-04-27 DIAGNOSIS — Z7982 Long term (current) use of aspirin: Secondary | ICD-10-CM

## 2018-04-27 DIAGNOSIS — E785 Hyperlipidemia, unspecified: Secondary | ICD-10-CM | POA: Diagnosis not present

## 2018-04-27 DIAGNOSIS — F418 Other specified anxiety disorders: Secondary | ICD-10-CM

## 2018-04-27 DIAGNOSIS — Z800 Family history of malignant neoplasm of digestive organs: Secondary | ICD-10-CM | POA: Diagnosis not present

## 2018-04-27 DIAGNOSIS — I10 Essential (primary) hypertension: Secondary | ICD-10-CM | POA: Insufficient documentation

## 2018-04-27 DIAGNOSIS — Z808 Family history of malignant neoplasm of other organs or systems: Secondary | ICD-10-CM

## 2018-04-27 DIAGNOSIS — Z923 Personal history of irradiation: Secondary | ICD-10-CM | POA: Insufficient documentation

## 2018-04-27 DIAGNOSIS — Z79899 Other long term (current) drug therapy: Secondary | ICD-10-CM

## 2018-04-27 DIAGNOSIS — C50212 Malignant neoplasm of upper-inner quadrant of left female breast: Secondary | ICD-10-CM | POA: Diagnosis not present

## 2018-04-27 DIAGNOSIS — Z79811 Long term (current) use of aromatase inhibitors: Secondary | ICD-10-CM | POA: Diagnosis not present

## 2018-04-27 LAB — CBC WITH DIFFERENTIAL/PLATELET
BASOS PCT: 1 %
Basophils Absolute: 0.1 10*3/uL (ref 0.0–0.1)
EOS ABS: 0.2 10*3/uL (ref 0.0–0.5)
EOS PCT: 4 %
HCT: 40.5 % (ref 34.8–46.6)
Hemoglobin: 13.4 g/dL (ref 11.6–15.9)
LYMPHS ABS: 1.8 10*3/uL (ref 0.9–3.3)
Lymphocytes Relative: 35 %
MCH: 29.1 pg (ref 25.1–34.0)
MCHC: 33 g/dL (ref 31.5–36.0)
MCV: 88.3 fL (ref 79.5–101.0)
Monocytes Absolute: 0.4 10*3/uL (ref 0.1–0.9)
Monocytes Relative: 8 %
Neutro Abs: 2.7 10*3/uL (ref 1.5–6.5)
Neutrophils Relative %: 52 %
PLATELETS: 248 10*3/uL (ref 145–400)
RBC: 4.58 MIL/uL (ref 3.70–5.45)
RDW: 13.6 % (ref 11.2–14.5)
WBC: 5.1 10*3/uL (ref 3.9–10.3)

## 2018-04-27 LAB — COMPREHENSIVE METABOLIC PANEL
ALBUMIN: 4.5 g/dL (ref 3.5–5.0)
ALT: 11 U/L (ref 0–55)
ANION GAP: 8 (ref 3–11)
AST: 21 U/L (ref 5–34)
Alkaline Phosphatase: 84 U/L (ref 40–150)
BUN: 14 mg/dL (ref 7–26)
CALCIUM: 9.8 mg/dL (ref 8.4–10.4)
CO2: 27 mmol/L (ref 22–29)
CREATININE: 0.88 mg/dL (ref 0.60–1.10)
Chloride: 105 mmol/L (ref 98–109)
GFR calc non Af Amer: >60 mL/min (ref >60)
Glucose, Bld: 75 mg/dL (ref 70–140)
POTASSIUM: 4 mmol/L (ref 3.5–5.1)
SODIUM: 140 mmol/L (ref 136–145)
TOTAL PROTEIN: 8 g/dL (ref 6.4–8.3)
Total Bilirubin: 0.6 mg/dL (ref 0.2–1.2)

## 2018-04-27 NOTE — Telephone Encounter (Signed)
Gave patient avs and calendar of upcoming June appointments.  °

## 2018-05-09 ENCOUNTER — Ambulatory Visit: Payer: Federal, State, Local not specified - PPO | Admitting: Women's Health

## 2018-05-09 ENCOUNTER — Encounter: Payer: Self-pay | Admitting: Women's Health

## 2018-05-09 VITALS — BP 136/80 | Ht 63.0 in | Wt 103.0 lb

## 2018-05-09 DIAGNOSIS — Z01419 Encounter for gynecological examination (general) (routine) without abnormal findings: Secondary | ICD-10-CM | POA: Diagnosis not present

## 2018-05-09 DIAGNOSIS — N898 Other specified noninflammatory disorders of vagina: Secondary | ICD-10-CM | POA: Diagnosis not present

## 2018-05-09 DIAGNOSIS — M818 Other osteoporosis without current pathological fracture: Secondary | ICD-10-CM | POA: Diagnosis not present

## 2018-05-09 LAB — WET PREP FOR TRICH, YEAST, CLUE

## 2018-05-09 MED ORDER — ALENDRONATE SODIUM 70 MG PO TABS: ORAL_TABLET | ORAL | 4 refills | Status: DC

## 2018-05-09 NOTE — Progress Notes (Signed)
Jessica [name] [birth year] [id number]    History:    Presents for breast and pelvic exam. Postmenopausal on no HRT with no bleeding. 11/2013 left breast cancer ER, PR positive HER-2 negative, unable to tolerate Arimidex, completed radiation therapy, declined chemotherapy. BRCA negative with 1 variant of indeterminate significance. 1990 right breast lumpectomy for atypical cells without further treatment. Normal pap history. 08/2016 Dexa t score at if 2.9 at AP spine, -1.8 at left femoral neck  Fosamax for 4 years. Smoker 3-4 cigarettes daily, refuses to quit. 10/2014 Negative colon polyps 5 year follow-up. Hypertension, hypercholesterolemia, hypothyroidism, GERD. Benign thyroid nodule managed by primary care. Not sexually active.  Past medical history, past surgical history, family history and social history were all reviewed and documented in the EPIC chart. 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 Has 4 children who are all doing well, 1 is nurse at clinic. Poor relationship with spouse, not abusive. Father of children was abusive, murdered many years ago. Grandson murdered in 2015.  ROS:  A ROS was performed and pertinent positives and negatives are included.  Exam:  Vitals:   05/09/18 1550  BP: 136/80  Weight: 103 lb (46.7 kg)  Height: '5\' 3"'  (1.6 m)   Body mass index is 18.25 kg/m.   General appearance:  Normal Thyroid:  Symmetrical, normal in size, without palpable masses or nodularity. Respiratory  Auscultation:  Clear without wheezing or rhonchi Cardiovascular  Auscultation:  Regular rate, without rubs, murmurs or gallops  Edema/varicosities:  Not grossly evident Abdominal  Soft,nontender, without masses, guarding or rebound.  Liver/spleen:  No organomegaly noted  Hernia:  None appreciated  Skin  Inspection:  Grossly normal   Breasts: Examined lying and sitting.     Right: Without masses, retractions, discharge or axillary adenopathy.     Left: Without masses,  retractions, discharge or axillary adenopathy. Gentitourinary   Inguinal/mons:  Normal without inguinal adenopathy  External genitalia:  Normal  BUS/Urethra/Skene's glands:  Normal  Vagina:  No bleeding, copious white, thin discharge without odor. Wet prep negative for yeast, clues.    Cervix:  Normal  Uterus:  Normal in size, shape and contour.  Midline and mobile  Adnexa/parametria:     Rt: Without masses or tenderness.   Lt: Without masses or tenderness.  Anus and perineum: Normal  Digital rectal exam: Normal sphincter tone without palpated masses or tenderness  Assessment/Plan:  67 y.o.  MBF G4P4 for annual exam without complaints. Appears well.  Postmenopausal on no HRT, no bleeding 11/2013 left breast cancer ER, PR positive HER-2 negative unable to tolerate Arimidex 1990 right breast lumpectomy-atypical cells no further treatment  Osteoporosis, Fosamax for 4 years, not currently taking for past year Hypertension, hypercholesterolemia, hypothyroidism, GERD, labs, managed by primary care History of benign thyroid nodule with negative annual thyroid ultrasounds, managed by the endocrinologist Smoker refuses  to quit  Plan: Fosamax 70 mg by mouth weekly prescription, proper use given and reviewed. Home safety, fall prevention, calcium-rich diet, exercise.  Repeat DEXA after completing 1 year more of Fosamax, reviewed best to take for 5 years.  SBE's, continue annual screening mammograms. Aware of need to quit smoking. Pap normal with HPV, new screening guidelines reviewed.  Reviewed if normal no further Paps had an abnormal Pap greater than 20 years ago with cryo and normal Paps after.  [place], 4:58 PM 05/09/2018

## 2018-05-09 NOTE — Progress Notes (Signed)
..  lb

## 2018-05-09 NOTE — Patient Instructions (Signed)
Health Maintenance for Postmenopausal Women Menopause is a normal process in which your reproductive ability comes to an end. This process happens gradually over a span of months to years, usually between the ages of 22 and 9. Menopause is complete when you have missed 12 consecutive menstrual periods. It is important to talk with your health care provider about some of the most common conditions that affect postmenopausal women, such as heart disease, cancer, and bone loss (osteoporosis). Adopting a healthy lifestyle and getting preventive care can help to promote your health and wellness. Those actions can also lower your chances of developing some of these common conditions. What should I know about menopause? During menopause, you may experience a number of symptoms, such as:  Moderate-to-severe hot flashes.  Night sweats.  Decrease in sex drive.  Mood swings.  Headaches.  Tiredness.  Irritability.  Memory problems.  Insomnia.  Choosing to treat or not to treat menopausal changes is an individual decision that you make with your health care provider. What should I know about hormone replacement therapy and supplements? Hormone therapy products are effective for treating symptoms that are associated with menopause, such as hot flashes and night sweats. Hormone replacement carries certain risks, especially as you become older. If you are thinking about using estrogen or estrogen with progestin treatments, discuss the benefits and risks with your health care provider. What should I know about heart disease and stroke? Heart disease, heart attack, and stroke become more likely as you age. This may be due, in part, to the hormonal changes that your body experiences during menopause. These can affect how your body processes dietary fats, triglycerides, and cholesterol. Heart attack and stroke are both medical emergencies. There are many things that you can do to help prevent heart disease  and stroke:  Have your blood pressure checked at least every 1-2 years. High blood pressure causes heart disease and increases the risk of stroke.  If you are 53-22 years old, ask your health care provider if you should take aspirin to prevent a heart attack or a stroke.  Do not use any tobacco products, including cigarettes, chewing tobacco, or electronic cigarettes. If you need help quitting, ask your health care provider.  It is important to eat a healthy diet and maintain a healthy weight. ? Be sure to include plenty of vegetables, fruits, low-fat dairy products, and lean protein. ? Avoid eating foods that are high in solid fats, added sugars, or salt (sodium).  Get regular exercise. This is one of the most important things that you can do for your health. ? Try to exercise for at least 150 minutes each week. The type of exercise that you do should increase your heart rate and make you sweat. This is known as moderate-intensity exercise. ? Try to do strengthening exercises at least twice each week. Do these in addition to the moderate-intensity exercise.  Know your numbers.Ask your health care provider to check your cholesterol and your blood glucose. Continue to have your blood tested as directed by your health care provider.  What should I know about cancer screening? There are several types of cancer. Take the following steps to reduce your risk and to catch any cancer development as early as possible. Breast Cancer  Practice breast self-awareness. ? This means understanding how your breasts normally appear and feel. ? It also means doing regular breast self-exams. Let your health care provider know about any changes, no matter how small.  If you are 40  or older, have a clinician do a breast exam (clinical breast exam or CBE) every year. Depending on your age, family history, and medical history, it may be recommended that you also have a yearly breast X-ray (mammogram).  If you  have a family history of breast cancer, talk with your health care provider about genetic screening.  If you are at high risk for breast cancer, talk with your health care provider about having an MRI and a mammogram every year.  Breast cancer (BRCA) gene test is recommended for women who have family members with BRCA-related cancers. Results of the assessment will determine the need for genetic counseling and BRCA1 and for BRCA2 testing. BRCA-related cancers include these types: ? Breast. This occurs in males or females. ? Ovarian. ? Tubal. This may also be called fallopian tube cancer. ? Cancer of the abdominal or pelvic lining (peritoneal cancer). ? Prostate. ? Pancreatic.  Cervical, Uterine, and Ovarian Cancer Your health care provider may recommend that you be screened regularly for cancer of the pelvic organs. These include your ovaries, uterus, and vagina. This screening involves a pelvic exam, which includes checking for microscopic changes to the surface of your cervix (Pap test).  For women ages 21-65, health care providers may recommend a pelvic exam and a Pap test every three years. For women ages 79-65, they may recommend the Pap test and pelvic exam, combined with testing for human papilloma virus (HPV), every five years. Some types of HPV increase your risk of cervical cancer. Testing for HPV may also be done on women of any age who have unclear Pap test results.  Other health care providers may not recommend any screening for nonpregnant women who are considered low risk for pelvic cancer and have no symptoms. Ask your health care provider if a screening pelvic exam is right for you.  If you have had past treatment for cervical cancer or a condition that could lead to cancer, you need Pap tests and screening for cancer for at least 20 years after your treatment. If Pap tests have been discontinued for you, your risk factors (such as having a new sexual partner) need to be  reassessed to determine if you should start having screenings again. Some women have medical problems that increase the chance of getting cervical cancer. In these cases, your health care provider may recommend that you have screening and Pap tests more often.  If you have a family history of uterine cancer or ovarian cancer, talk with your health care provider about genetic screening.  If you have vaginal bleeding after reaching menopause, tell your health care provider.  There are currently no reliable tests available to screen for ovarian cancer.  Lung Cancer Lung cancer screening is recommended for adults 69-62 years old who are at high risk for lung cancer because of a history of smoking. A yearly low-dose CT scan of the lungs is recommended if you:  Currently smoke.  Have a history of at least 30 pack-years of smoking and you currently smoke or have quit within the past 15 years. A pack-year is smoking an average of one pack of cigarettes per day for one year.  Yearly screening should:  Continue until it has been 15 years since you quit.  Stop if you develop a health problem that would prevent you from having lung cancer treatment.  Colorectal Cancer  This type of cancer can be detected and can often be prevented.  Routine colorectal cancer screening usually begins at  age 42 and continues through age 45.  If you have risk factors for colon cancer, your health care provider may recommend that you be screened at an earlier age.  If you have a family history of colorectal cancer, talk with your health care provider about genetic screening.  Your health care provider may also recommend using home test kits to check for hidden blood in your stool.  A small camera at the end of a tube can be used to examine your colon directly (sigmoidoscopy or colonoscopy). This is done to check for the earliest forms of colorectal cancer.  Direct examination of the colon should be repeated every  5-10 years until age 71. However, if early forms of precancerous polyps or small growths are found or if you have a family history or genetic risk for colorectal cancer, you may need to be screened more often.  Skin Cancer  Check your skin from head to toe regularly.  Monitor any moles. Be sure to tell your health care provider: ? About any new moles or changes in moles, especially if there is a change in a mole's shape or color. ? If you have a mole that is larger than the size of a pencil eraser.  If any of your family members has a history of skin cancer, especially at a Takota Cahalan age, talk with your health care provider about genetic screening.  Always use sunscreen. Apply sunscreen liberally and repeatedly throughout the day.  Whenever you are outside, protect yourself by wearing long sleeves, pants, a wide-brimmed hat, and sunglasses.  What should I know about osteoporosis? Osteoporosis is a condition in which bone destruction happens more quickly than new bone creation. After menopause, you may be at an increased risk for osteoporosis. To help prevent osteoporosis or the bone fractures that can happen because of osteoporosis, the following is recommended:  If you are 46-71 years old, get at least 1,000 mg of calcium and at least 600 mg of vitamin D per day.  If you are older than age 55 but younger than age 65, get at least 1,200 mg of calcium and at least 600 mg of vitamin D per day.  If you are older than age 54, get at least 1,200 mg of calcium and at least 800 mg of vitamin D per day.  Smoking and excessive alcohol intake increase the risk of osteoporosis. Eat foods that are rich in calcium and vitamin D, and do weight-bearing exercises several times each week as directed by your health care provider. What should I know about how menopause affects my mental health? Depression may occur at any age, but it is more common as you become older. Common symptoms of depression  include:  Low or sad mood.  Changes in sleep patterns.  Changes in appetite or eating patterns.  Feeling an overall lack of motivation or enjoyment of activities that you previously enjoyed.  Frequent crying spells.  Talk with your health care provider if you think that you are experiencing depression. What should I know about immunizations? It is important that you get and maintain your immunizations. These include:  Tetanus, diphtheria, and pertussis (Tdap) booster vaccine.  Influenza every year before the flu season begins.  Pneumonia vaccine.  Shingles vaccine.  Your health care provider may also recommend other immunizations. This information is not intended to replace advice given to you by your health care provider. Make sure you discuss any questions you have with your health care provider. Document Released: 12/25/2005  Document Revised: 05/22/2016 Document Reviewed: 08/06/2015 Elsevier Interactive Patient Education  2018 Elsevier Inc.  

## 2018-05-11 LAB — PAP, TP IMAGING W/ HPV RNA, RFLX HPV TYPE 16,18/45: HPV DNA HIGH RISK: NOT DETECTED

## 2018-05-26 ENCOUNTER — Other Ambulatory Visit: Payer: Self-pay | Admitting: Family Medicine

## 2018-05-26 DIAGNOSIS — E042 Nontoxic multinodular goiter: Secondary | ICD-10-CM

## 2018-08-11 ENCOUNTER — Other Ambulatory Visit: Payer: Self-pay | Admitting: Family Medicine

## 2018-08-11 DIAGNOSIS — E042 Nontoxic multinodular goiter: Secondary | ICD-10-CM

## 2018-08-18 ENCOUNTER — Ambulatory Visit
Admission: RE | Admit: 2018-08-18 | Discharge: 2018-08-18 | Disposition: A | Discharge status: Home / Self Care | Payer: Federal, State, Local not specified - PPO | Source: Ambulatory Visit | Attending: Family Medicine | Admitting: Family Medicine

## 2018-08-18 DIAGNOSIS — E042 Nontoxic multinodular goiter: Secondary | ICD-10-CM

## 2019-05-16 ENCOUNTER — Encounter: Payer: Self-pay | Admitting: Oncology

## 2019-05-31 ENCOUNTER — Other Ambulatory Visit: Payer: Self-pay

## 2019-05-31 ENCOUNTER — Inpatient Hospital Stay: Payer: Federal, State, Local not specified - PPO | Attending: Oncology

## 2019-05-31 ENCOUNTER — Inpatient Hospital Stay (HOSPITAL_BASED_OUTPATIENT_CLINIC_OR_DEPARTMENT_OTHER): Payer: Federal, State, Local not specified - PPO | Admitting: Oncology

## 2019-05-31 VITALS — BP 143/70 | HR 49 | Temp 99.1°F | Resp 18 | Wt 103.6 lb

## 2019-05-31 DIAGNOSIS — Z853 Personal history of malignant neoplasm of breast: Secondary | ICD-10-CM

## 2019-05-31 DIAGNOSIS — F418 Other specified anxiety disorders: Secondary | ICD-10-CM | POA: Diagnosis not present

## 2019-05-31 DIAGNOSIS — Z170 Estrogen receptor positive status [ER+]: Secondary | ICD-10-CM

## 2019-05-31 DIAGNOSIS — K219 Gastro-esophageal reflux disease without esophagitis: Secondary | ICD-10-CM | POA: Diagnosis not present

## 2019-05-31 DIAGNOSIS — C50212 Malignant neoplasm of upper-inner quadrant of left female breast: Secondary | ICD-10-CM

## 2019-05-31 DIAGNOSIS — R232 Flushing: Secondary | ICD-10-CM | POA: Insufficient documentation

## 2019-05-31 DIAGNOSIS — M810 Age-related osteoporosis without current pathological fracture: Secondary | ICD-10-CM | POA: Insufficient documentation

## 2019-05-31 DIAGNOSIS — Z79899 Other long term (current) drug therapy: Secondary | ICD-10-CM | POA: Insufficient documentation

## 2019-05-31 DIAGNOSIS — F17210 Nicotine dependence, cigarettes, uncomplicated: Secondary | ICD-10-CM | POA: Insufficient documentation

## 2019-05-31 DIAGNOSIS — E041 Nontoxic single thyroid nodule: Secondary | ICD-10-CM | POA: Diagnosis not present

## 2019-05-31 DIAGNOSIS — I10 Essential (primary) hypertension: Secondary | ICD-10-CM | POA: Diagnosis not present

## 2019-05-31 DIAGNOSIS — E785 Hyperlipidemia, unspecified: Secondary | ICD-10-CM | POA: Diagnosis not present

## 2019-05-31 DIAGNOSIS — Z923 Personal history of irradiation: Secondary | ICD-10-CM

## 2019-05-31 DIAGNOSIS — Z800 Family history of malignant neoplasm of digestive organs: Secondary | ICD-10-CM | POA: Diagnosis not present

## 2019-05-31 DIAGNOSIS — E559 Vitamin D deficiency, unspecified: Secondary | ICD-10-CM | POA: Diagnosis not present

## 2019-05-31 DIAGNOSIS — C50412 Malignant neoplasm of upper-outer quadrant of left female breast: Secondary | ICD-10-CM

## 2019-05-31 DIAGNOSIS — M818 Other osteoporosis without current pathological fracture: Secondary | ICD-10-CM

## 2019-05-31 LAB — CBC WITH DIFFERENTIAL/PLATELET
Abs Immature Granulocytes: 0.02 10*3/uL (ref 0.00–0.07)
Basophils Absolute: 0.1 10*3/uL (ref 0.0–0.1)
Basophils Relative: 1 %
Eosinophils Absolute: 0.3 10*3/uL (ref 0.0–0.5)
Eosinophils Relative: 4 %
HCT: 39 % (ref 36.0–46.0)
Hemoglobin: 12.4 g/dL (ref 12.0–15.0)
Immature Granulocytes: 0 %
Lymphocytes Relative: 39 %
Lymphs Abs: 2.7 10*3/uL (ref 0.7–4.0)
MCH: 28.8 pg (ref 26.0–34.0)
MCHC: 31.8 g/dL (ref 30.0–36.0)
MCV: 90.7 fL (ref 80.0–100.0)
Monocytes Absolute: 0.6 10*3/uL (ref 0.1–1.0)
Monocytes Relative: 8 %
Neutro Abs: 3.3 10*3/uL (ref 1.7–7.7)
Neutrophils Relative %: 48 %
Platelets: 265 10*3/uL (ref 150–400)
RBC: 4.3 MIL/uL (ref 3.87–5.11)
RDW: 13.1 % (ref 11.5–15.5)
WBC: 6.9 10*3/uL (ref 4.0–10.5)
nRBC: 0 % (ref 0.0–0.2)

## 2019-05-31 LAB — COMPREHENSIVE METABOLIC PANEL
ALT: 13 U/L (ref 0–44)
AST: 23 U/L (ref 15–41)
Albumin: 4.2 g/dL (ref 3.5–5.0)
Alkaline Phosphatase: 73 U/L (ref 38–126)
Anion gap: 9 (ref 5–15)
BUN: 14 mg/dL (ref 8–23)
CO2: 24 mmol/L (ref 22–32)
Calcium: 8.9 mg/dL (ref 8.9–10.3)
Chloride: 107 mmol/L (ref 98–111)
Creatinine, Ser: 0.91 mg/dL (ref 0.44–1.00)
GFR calc Af Amer: >60 mL/min (ref >60)
GFR calc non Af Amer: >60 mL/min (ref >60)
Glucose, Bld: 80 mg/dL (ref 70–99)
Potassium: 4 mmol/L (ref 3.5–5.1)
Sodium: 140 mmol/L (ref 135–145)
Total Bilirubin: 0.8 mg/dL (ref 0.3–1.2)
Total Protein: 7.6 g/dL (ref 6.5–8.1)

## 2019-05-31 NOTE — Progress Notes (Signed)
Jessica [name]  [phone] [phone]     ID: Jessica [name] OB: 06-04-1951  MR#: [id number]  IRC#:[id number]  Patient Care Team: Leighton Ruff, MD as PCP - General (Family Medicine) Isys Michaelson, Virgie Dad, MD as Consulting Physician (Oncology) Phineas Real, Belinda Block, MD as Consulting Physician (Gynecology) [name], NP as Nurse Practitioner (Obstetrics and Gynecology) OTHER MD: [name], MD Gertie Fey)  CHIEF COMPLAINT: estrogen receptor positive breast cancer  CURRENT TREATMENT: Observation   INTERVAL HISTORY: Jessica [name] returns today for follow-up of her estrogen receptor positive breast cancer. She continues under observation.   Since her last visit, she underwent bilateral diagnostic mammography with tomography at [facility] on 05/16/2019 showing: breast density category B; no evidence of malignancy in either breast.   REVIEW OF SYSTEMS: Jessica [name] reports she has not been working since March, but she notes she's been paid. She is at home with her husband, who is retired. They walk for exercise and have a "mini gym" in the basement. She states she loves to dance. A detailed review of systems was otherwise stable.     BREAST CANCER HISTORY: From the original intake note:  "Jessica [name]" has noted a mass in her left breast for at least several months. She didn't think it was worth bringing to medical attention. When she went for routine gynecologic followup under [name] NP, this was palpated in the patient was set up for bilateral diagnostic mammography 10/23/2013 at [facility]. She was found to have breast density category C. A new irregular mass was noted in the left axillary tail. Ultrasound was obtained the same day and showed a 1.4 cm lobulated mass in the left breast at the 2:00 position. Biopsy of this mass 11/06/2013 showed ([id number]) an invasive ductal carcinoma, grade 2 or 3, estrogen receptor 80% positive, with moderate staining intensity;  progesterone receptor 90% positive with strong staining intensity; with an MIB-1 of 10% and no HER-2 amplification (these signals ratio was 1.54 in the copy number per cell was 2.00).  On 11/13/2013 the patient underwent bilateral breast MRI. This showed a 1.3 cm irregular enhancing mass in the upper outer quadrant of the left breast (axillary tail). There were no other masses of concern in either breast and no abnormal appearing lymph nodes.  The patient's subsequent history is as detailed below.   PAST MEDICAL HISTORY: Past Medical History:  Diagnosis Date  . Anxiety   . Breast cancer ([place]) 11/06/13   left  . Cancer ([place])    BREAST CANCER 1990'S  . Depression   . Family history of colon cancer   . Genetic testing   . GERD (gastroesophageal reflux disease)   . Hot flashes   . Hx of radiation therapy 01/17/14- 02/28/14   left breast 5000 cGy in 25 sessions, left breast boost 1000 cGy in 5 sessions  . Hyperlipidemia   . Hypertension    DR. KINGSLEY  . Osteoporosis 08/2016   T score -2.9 AP spine statistically improved from prior DEXA. Stable and other sites.  . Smoker   . Thyroid nodule    BENIGN-DR CORNELL  . Vitamin D deficiency     PAST SURGICAL HISTORY: Past Surgical History:  Procedure Laterality Date  . ARM SURGERY     AT AGE 44-PLATE INSERTED-DUE TO FALL-lt  . BIOPSY THYROID    . BREAST LUMPECTOMY WITH NEEDLE LOCALIZATION AND AXILLARY SENTINEL LYMPH NODE BX Left 12/11/2013   Procedure: BREAST LUMPECTOMY WITH NEEDLE LOCALIZATION AND AXILLARY SENTINEL LYMPH NODE BX;  Surgeon: [name], MD;  Location: [facility];  Service: General;  Laterality: Left;  . BREAST SURGERY  1994   rt lump  . GYNECOLOGIC CRYOSURGERY  1980   CYTOTHERAPY OF CERVIX  . TUBAL LIGATION      FAMILY HISTORY Family History  Problem Relation Age of Onset  . Colon cancer Mother 11       deceased 23  . Stomach cancer Sister        Dx and died in 48s; maternal half-sister  .  Heart disease Brother   . Throat cancer Father        Dx and died in 57s; smoker  . Colon cancer Sister 110       currently 39; maternal half-sister   the patient's father died in his mid 39s with cancer of the throat. The patient's mother died at the age of 18 with colon cancer. The patient had 4 brothers and 5 sisters. One sister was diagnosed with stomach cancer but the patient does not know at what age. One sister was diagnosed with colon cancer at age 84. There is no history of breast or ovarian cancer in the family to the patient's knowledge.   GYNECOLOGIC HISTORY:  Menarche age 35, first live birth age 69, the patient is [place] P4. She thinks she went through menopause approximately age 49. She did not take hormone replacement.   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 Her (second) husband [name] (goes by [name]) is retired from the [employer]. He works part-time as a Horticulturist, commercial. Daughter [name] lives in [place] where she works as a Product/process development scientist. Daughter [name] lives in [place] where she works as a Cabin crew. Daughter [name] works as a Marine scientist in [employer] here in [place]. Daughter [name] works as a Emergency planning/management officer in [place]. The patient has 7 grandchildren. She is a Psychologist, forensic.     ADVANCED DIRECTIVES: In place; she has named her daughter [name] as healthcare power of attorney   HEALTH MAINTENANCE: Social History   Tobacco Use  . Smoking status: Current Every Day Smoker    Packs/day: 0.50    Years: 43.00    Pack years: 21.50    Types: Cigarettes  . Smokeless tobacco: Never Used  . Tobacco comment: 01/29/14 decreased to 3 cigarettes daily  Substance Use Topics  . Alcohol use: Yes    Alcohol/week: 1.0 standard drinks    Types: 1 Glasses of wine per week  . Drug use: No     Colonoscopy:2005  PAP:2014  Bone density:October 2017 showed osteoporosis  Lipid  panel:  Allergies  Allergen Reactions  . Aspirin Nausea And Vomiting  . Effexor [Venlafaxine Hydrochloride] Other (See Comments)    Sees things   . Ivp Dye [Iodinated Diagnostic Agents]     Happened 1970s per pt-sob  . Penicillins     REACTION: hives/tongue swelled    Current Outpatient Medications  Medication Sig Dispense Refill  . alendronate (FOSAMAX) 70 MG tablet TAKE 1 TABLET BY MOUTH EVERY WEEK WITH FULL GLASS OF WATER 12 tablet 4  . amLODipine (NORVASC) 5 MG tablet Take 5 mg by mouth daily.      . Cholecalciferol (VITAMIN D PO) Take 1 tablet by mouth daily.    Marland Kitchen emollient (BIAFINE) cream Apply topically as needed.     . hydrochlorothiazide (HYDRODIURIL) 25 MG tablet Take 25 mg by mouth at bedtime.    Marland Kitchen  Multiple Vitamins-Minerals (MULTIVITAMIN WITH MINERALS) tablet Take 1 tablet by mouth daily.    Marland Kitchen PARoxetine HCl (PAXIL PO) Take 10 mg by mouth See admin instructions. Pt takes only first 2 weeks of the month     No current facility-administered medications for this visit.     OBJECTIVE: Middle-aged [place] American woman who appears well  Vitals:   05/31/19 1538  BP: (!) 143/70  Pulse: (!) 49  Resp: 18  Temp: 99.1 F (37.3 C)  SpO2: 100%     Body mass index is 18.35 kg/m.    ECOG FS:0 - Asymptomatic  Sclerae unicteric, pupils round and equal Wearing a mask No cervical or supraclavicular adenopathy Lungs no rales or rhonchi Heart regular rate and rhythm Abd soft, nontender, positive bowel sounds MSK no focal spinal tenderness, no upper extremity lymphedema Neuro: nonfocal, well oriented, appropriate affect Breasts: Status post bilateral lumpectomies.  There are no skin or nipple changes of concern.  Both axillae are benign.  LAB RESULTS:  CMP     Component Value Date/Time   NA 140 05/31/2019 1455   NA 140 01/19/2017 1553   K 4.0 05/31/2019 1455   K 3.8 01/19/2017 1553   CL 107 05/31/2019 1455   CO2 24 05/31/2019 1455   CO2 27 01/19/2017 1553   GLUCOSE  80 05/31/2019 1455   GLUCOSE 89 01/19/2017 1553   BUN 14 05/31/2019 1455   BUN 11.3 01/19/2017 1553   CREATININE 0.91 05/31/2019 1455   CREATININE 0.8 01/19/2017 1553   CALCIUM 8.9 05/31/2019 1455   CALCIUM 10.3 01/19/2017 1553   PROT 7.6 05/31/2019 1455   PROT 8.0 01/19/2017 1553   ALBUMIN 4.2 05/31/2019 1455   ALBUMIN 4.4 01/19/2017 1553   AST 23 05/31/2019 1455   AST 23 01/19/2017 1553   ALT 13 05/31/2019 1455   ALT 16 01/19/2017 1553   ALKPHOS 73 05/31/2019 1455   ALKPHOS 82 01/19/2017 1553   BILITOT 0.8 05/31/2019 1455   BILITOT 0.53 01/19/2017 1553   GFRNONAA >60 05/31/2019 1455   GFRAA >60 05/31/2019 1455    I No results found for: SPEP  Lab Results  Component Value Date   WBC 6.9 05/31/2019   NEUTROABS 3.3 05/31/2019   HGB 12.4 05/31/2019   HCT 39.0 05/31/2019   MCV 90.7 05/31/2019   PLT 265 05/31/2019      Chemistry      Component Value Date/Time   NA 140 05/31/2019 1455   NA 140 01/19/2017 1553   K 4.0 05/31/2019 1455   K 3.8 01/19/2017 1553   CL 107 05/31/2019 1455   CO2 24 05/31/2019 1455   CO2 27 01/19/2017 1553   BUN 14 05/31/2019 1455   BUN 11.3 01/19/2017 1553   CREATININE 0.91 05/31/2019 1455   CREATININE 0.8 01/19/2017 1553      Component Value Date/Time   CALCIUM 8.9 05/31/2019 1455   CALCIUM 10.3 01/19/2017 1553   ALKPHOS 73 05/31/2019 1455   ALKPHOS 82 01/19/2017 1553   AST 23 05/31/2019 1455   AST 23 01/19/2017 1553   ALT 13 05/31/2019 1455   ALT 16 01/19/2017 1553   BILITOT 0.8 05/31/2019 1455   BILITOT 0.53 01/19/2017 1553       No results found for: LABCA2  No components found for: LABCA125  No results for input(s): INR in the last 168 hours.  Urinalysis    Component Value Date/Time   COLORURINE YELLOW 07/16/2016 1650    STUDIES: Repeat mammography due at  [facility] June 2019  ASSESSMENT: 68 y.o. [place] woman  (1) status post right upper inner quadrant lumpectomy 1994 for "breast cancer"; no data available;  patient did not receive radiation, chemotherapy, or antiestrogen treatment  (2) status post left breast biopsy 11/06/2013 for a clinical T1c N0, stage IA invasive ductal carcinoma, grade 2, estrogen and progesterone receptor positive, HER-2 not amplified, with an MIB-1 of 10%  (3) genetics testing We recommended you pursue genetic testing of the genes on the CancerNext gene panel. Your test, which included sequencing and deletion/duplication analysis, was performed at [facility]. Testing did not reveal any clearly harmful mutations in any of these genes. The genes on the panel were APC, ATM, BARD1, BRCA1, BRCA2, BRIP1, BMPR1A, CDH1, CDK4, CDKN2A, CHEK2, EPCAM, GREM1, MLH1, MRE11A, MSH2, MSH6, MUTYH, NBN, NF1, PALB2, PMS2, POLD  (4) status post left lumpectomy and sentinel lymph node biopsy 12/11/2013 for a pT1c pN0, stage IA invasive ductal carcinoma, grade 3, repeat HER-2 testing again negative  (5) Oncotype DX score of 16 predicts a 10 year risk of outside the breast recurrence of 10% if the patient's only systemic treatment is tamoxifen for 5 years. Is also predicts no benefit from chemotherapy.  (6) radiation therapy completed 02/28/2014  (7) started anastrozole July 2015--  discontinued September 2015 secondary to bone aches  (8) cigarette abuse--stopped smoking August 2016  (9) tamoxifen prescribed 02/19/2015 -- patient never started it   (10) osteoporosis on bone density scan October 2017: on alendronate  PLAN: Jessica [name] is now a little over 5 years out from definitive surgery for breast cancer with no evidence of disease recurrence.  This is very favorable.  At this point I am comfortable releasing her from follow-up with me.  All she will need in terms of breast cancer follow-up is her yearly mammography and yearly physician breast exam.  She is interested in participating in our survivorship program and I have made the appropriate referral.  I will be glad to see Jessica [name] again at  any point in the future if and when the need arises but as of now are making no further routine appointments for her with me here.   Charles Andringa, Virgie Dad, MD  05/31/19 6:08 PM Medical Oncology and Hematology [facility] [address] [place], [place] [address] Tel. [phone]    Fax. [phone]   I, Wilburn Mylar, am acting as scribe for Dr. Virgie Dad. Deysha Cartier.  I, Lurline Del MD, have reviewed the above documentation for accuracy and completeness, and I agree with the above.

## 2019-06-01 ENCOUNTER — Telehealth: Payer: Self-pay | Admitting: Adult Health

## 2019-06-01 NOTE — Telephone Encounter (Signed)
I left a message regarding schedule I will mail °

## 2019-06-20 ENCOUNTER — Other Ambulatory Visit: Payer: Self-pay | Admitting: Women's Health

## 2019-06-20 DIAGNOSIS — M818 Other osteoporosis without current pathological fracture: Secondary | ICD-10-CM

## 2019-06-20 NOTE — Telephone Encounter (Signed)
Staff message sent to appt desk to call her to schedule CE as she is overdue.

## 2019-08-23 ENCOUNTER — Encounter: Payer: Self-pay | Admitting: Gynecology

## 2019-10-18 ENCOUNTER — Encounter: Payer: Self-pay | Admitting: Gastroenterology

## 2020-05-13 ENCOUNTER — Telehealth: Payer: Self-pay | Admitting: Adult Health

## 2020-05-13 NOTE — Telephone Encounter (Signed)
R/s appt per 6/26 sch message. Provider on PAL.

## 2020-05-31 ENCOUNTER — Encounter: Payer: Federal, State, Local not specified - PPO | Admitting: Adult Health

## 2020-06-04 ENCOUNTER — Encounter: Payer: Self-pay | Admitting: Adult Health

## 2020-06-04 ENCOUNTER — Other Ambulatory Visit: Payer: Self-pay

## 2020-06-04 ENCOUNTER — Inpatient Hospital Stay: Payer: Federal, State, Local not specified - PPO | Attending: Adult Health | Admitting: Adult Health

## 2020-06-04 VITALS — BP 151/95 | HR 66 | Temp 98.5°F | Resp 18 | Ht 63.0 in | Wt 100.0 lb

## 2020-06-04 DIAGNOSIS — Z79899 Other long term (current) drug therapy: Secondary | ICD-10-CM | POA: Diagnosis not present

## 2020-06-04 DIAGNOSIS — F418 Other specified anxiety disorders: Secondary | ICD-10-CM | POA: Insufficient documentation

## 2020-06-04 DIAGNOSIS — R232 Flushing: Secondary | ICD-10-CM | POA: Insufficient documentation

## 2020-06-04 DIAGNOSIS — Z923 Personal history of irradiation: Secondary | ICD-10-CM | POA: Insufficient documentation

## 2020-06-04 DIAGNOSIS — Z87891 Personal history of nicotine dependence: Secondary | ICD-10-CM | POA: Insufficient documentation

## 2020-06-04 DIAGNOSIS — K219 Gastro-esophageal reflux disease without esophagitis: Secondary | ICD-10-CM | POA: Insufficient documentation

## 2020-06-04 DIAGNOSIS — M810 Age-related osteoporosis without current pathological fracture: Secondary | ICD-10-CM | POA: Diagnosis not present

## 2020-06-04 DIAGNOSIS — Z853 Personal history of malignant neoplasm of breast: Secondary | ICD-10-CM | POA: Diagnosis not present

## 2020-06-04 DIAGNOSIS — E785 Hyperlipidemia, unspecified: Secondary | ICD-10-CM | POA: Insufficient documentation

## 2020-06-04 DIAGNOSIS — C50212 Malignant neoplasm of upper-inner quadrant of left female breast: Secondary | ICD-10-CM

## 2020-06-04 DIAGNOSIS — Z170 Estrogen receptor positive status [ER+]: Secondary | ICD-10-CM

## 2020-06-04 DIAGNOSIS — I10 Essential (primary) hypertension: Secondary | ICD-10-CM | POA: Insufficient documentation

## 2020-06-04 DIAGNOSIS — Z800 Family history of malignant neoplasm of digestive organs: Secondary | ICD-10-CM | POA: Insufficient documentation

## 2020-06-04 NOTE — Progress Notes (Signed)
[place]  [phone] [phone]     ID: Jessica [name] OB: 07/28/1951  MR#: [id number]  TDD#:[id number]  Patient Care Team: Leighton Ruff, MD as PCP - General (Family Medicine) Magrinat, Virgie Dad, MD as Consulting Physician (Oncology) Fontaine, Belinda Block, MD (Inactive) as Consulting Physician (Gynecology) [name], NP as Nurse Practitioner (Obstetrics and Gynecology) OTHER MD: [name], MD Gertie Fey)  CHIEF COMPLAINT: estrogen receptor positive breast cancer  CURRENT TREATMENT: Observation   INTERVAL HISTORY: Jessica [name] returns today for follow-up of her estrogen receptor positive breast cancer. She continues under observation.   She is doing well today and her mammogram has been scheduled.  She is feeling well and has no new issues.   REVIEW OF SYSTEMS: Jessica [name] has no fever, chills, chest pain, palpitations, cough, bowel/bladder changes, headaches, vision issues, shortness of breath, or nausea/vomiting.  A detailed ROS was otherwise non contributory.    She remains active, and is up to date with seeing her PCP and with her cancer screenings.      BREAST CANCER HISTORY: From the original intake note:  "Jessica [name]" has noted a mass in her left breast for at least several months. She didn't think it was worth bringing to medical attention. When she went for routine gynecologic followup under [name] NP, this was palpated in the patient was set up for bilateral diagnostic mammography 10/23/2013 at [facility]. She was found to have breast density category C. A new irregular mass was noted in the left axillary tail. Ultrasound was obtained the same day and showed a 1.4 cm lobulated mass in the left breast at the 2:00 position. Biopsy of this mass 11/06/2013 showed ([id number]) an invasive ductal carcinoma, grade 2 or 3, estrogen receptor 80% positive, with moderate staining intensity; progesterone receptor 90% positive with strong staining  intensity; with an MIB-1 of 10% and no HER-2 amplification (these signals ratio was 1.54 in the copy number per cell was 2.00).  On 11/13/2013 the patient underwent bilateral breast MRI. This showed a 1.3 cm irregular enhancing mass in the upper outer quadrant of the left breast (axillary tail). There were no other masses of concern in either breast and no abnormal appearing lymph nodes.  The patient's subsequent history is as detailed below.   PAST MEDICAL HISTORY: Past Medical History:  Diagnosis Date  . Anxiety   . Breast cancer ([place]) 11/06/13   left  . Cancer ([place])    BREAST CANCER 1990'S  . Depression   . Family history of colon cancer   . Genetic testing   . GERD (gastroesophageal reflux disease)   . Hot flashes   . Hx of radiation therapy 01/17/14- 02/28/14   left breast 5000 cGy in 25 sessions, left breast boost 1000 cGy in 5 sessions  . Hyperlipidemia   . Hypertension    DR. KINGSLEY  . Osteoporosis 08/2016   T score -2.9 AP spine statistically improved from prior DEXA. Stable and other sites.  . Smoker   . Thyroid nodule    BENIGN-DR CORNELL  . Vitamin D deficiency     PAST SURGICAL HISTORY: Past Surgical History:  Procedure Laterality Date  . ARM SURGERY     AT AGE 18-PLATE INSERTED-DUE TO FALL-lt  . BIOPSY THYROID    . BREAST LUMPECTOMY WITH NEEDLE LOCALIZATION AND AXILLARY SENTINEL LYMPH NODE BX Left 12/11/2013   Procedure: BREAST LUMPECTOMY WITH NEEDLE LOCALIZATION AND AXILLARY SENTINEL LYMPH NODE BX;  Surgeon: [name], MD;  Location: [facility];  Service: General;  Laterality: Left;  . BREAST SURGERY  1994   rt lump  . GYNECOLOGIC CRYOSURGERY  1980   CYTOTHERAPY OF CERVIX  . TUBAL LIGATION      FAMILY HISTORY Family History  Problem Relation Age of Onset  . Colon cancer Mother 76       deceased 50  . Stomach cancer Sister        Dx and died in 51s; maternal half-sister  . Heart disease Brother   . Throat cancer Father         Dx and died in 23s; smoker  . Colon cancer Sister 61       currently 72; maternal half-sister   the patient's father died in his mid 27s with cancer of the throat. The patient's mother died at the age of 71 with colon cancer. The patient had 4 brothers and 5 sisters. One sister was diagnosed with stomach cancer but the patient does not know at what age. One sister was diagnosed with colon cancer at age 38. There is no history of breast or ovarian cancer in the family to the patient's knowledge.   GYNECOLOGIC HISTORY:  Menarche age 88, first live birth age 11, the patient is [place] P4. She thinks she went through menopause approximately age 62. She did not take hormone replacement.   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 Her (second) husband [name] (goes by [name]) is retired from the [employer]. He works part-time as a Horticulturist, commercial. Daughter [name] lives in [place] where she works as a Product/process development scientist. Daughter [name] lives in [place] where she works as a Cabin crew. Daughter [name] works as a Marine scientist in [employer] here in [place]. Daughter [name] works as a Emergency planning/management officer in [place]. The patient has 7 grandchildren. She is a Psychologist, forensic.     ADVANCED DIRECTIVES: In place; she has named her daughter [name] as healthcare power of attorney   HEALTH MAINTENANCE: Social History   Tobacco Use  . Smoking status: Current Every Day Smoker    Packs/day: 0.50    Years: 43.00    Pack years: 21.50    Types: Cigarettes  . Smokeless tobacco: Never Used  . Tobacco comment: 01/29/14 decreased to 3 cigarettes daily  Vaping Use  . Vaping Use: Never used  Substance Use Topics  . Alcohol use: Yes    Alcohol/week: 1.0 standard drink    Types: 1 Glasses of wine per week  . Drug use: No     Colonoscopy:2005  PAP:2014  Bone density:October 2017 showed osteoporosis  Lipid panel:  Allergies   Allergen Reactions  . Aspirin Nausea And Vomiting  . Effexor [Venlafaxine Hydrochloride] Other (See Comments)    Sees things   . Ivp Dye [Iodinated Diagnostic Agents]     Happened 1970s per pt-sob  . Penicillins     REACTION: hives/tongue swelled    Current Outpatient Medications  Medication Sig Dispense Refill  . alendronate (FOSAMAX) 70 MG tablet TAKE 1 TABLET BY MOUTH EVERY WEEK WITH FULL GLASS OF WATER 12 tablet 0  . amLODipine (NORVASC) 5 MG tablet Take 5 mg by mouth daily.      . Cholecalciferol (VITAMIN D PO) Take 1 tablet by mouth daily.    Marland Kitchen emollient (BIAFINE) cream Apply topically as needed.     . hydrochlorothiazide (HYDRODIURIL) 25 MG tablet Take 25 mg by mouth at bedtime.    Marland Kitchen  Multiple Vitamins-Minerals (MULTIVITAMIN WITH MINERALS) tablet Take 1 tablet by mouth daily.    Marland Kitchen PARoxetine HCl (PAXIL PO) Take 10 mg by mouth See admin instructions. Pt takes only first 2 weeks of the month     No current facility-administered medications for this visit.    OBJECTIVE: Middle-aged [place] American woman who appears well  Vitals:   06/04/20 1441  BP: (!) 151/95  Pulse: 66  Resp: 18  Temp: 98.5 F (36.9 C)  SpO2: 100%     Body mass index is 17.71 kg/m.    ECOG FS:0 - Asymptomatic  GENERAL: Patient is a well appearing female in no acute distress HEENT:  Sclerae anicteric.  Mask in place. Neck is supple.  NODES:  No cervical, supraclavicular, or axillary lymphadenopathy palpated.  BREAST EXAM:  S/p bilateral lumpectomies, no sign of local recurrence LUNGS:  Clear to auscultation bilaterally.  No wheezes or rhonchi. HEART:  Regular rate and rhythm. No murmur appreciated. ABDOMEN:  Soft, nontender.  Positive, normoactive bowel sounds. No organomegaly palpated. MSK:  No focal spinal tenderness to palpation. Full range of motion bilaterally in the upper extremities. EXTREMITIES:  No peripheral edema.   SKIN:  Clear with no obvious rashes or skin changes. No nail  dyscrasia. NEURO:  Nonfocal. Well oriented.  Appropriate affect.   LAB RESULTS:  CMP     Component Value Date/Time   NA 140 05/31/2019 1455   NA 140 01/19/2017 1553   K 4.0 05/31/2019 1455   K 3.8 01/19/2017 1553   CL 107 05/31/2019 1455   CO2 24 05/31/2019 1455   CO2 27 01/19/2017 1553   GLUCOSE 80 05/31/2019 1455   GLUCOSE 89 01/19/2017 1553   BUN 14 05/31/2019 1455   BUN 11.3 01/19/2017 1553   CREATININE 0.91 05/31/2019 1455   CREATININE 0.8 01/19/2017 1553   CALCIUM 8.9 05/31/2019 1455   CALCIUM 10.3 01/19/2017 1553   PROT 7.6 05/31/2019 1455   PROT 8.0 01/19/2017 1553   ALBUMIN 4.2 05/31/2019 1455   ALBUMIN 4.4 01/19/2017 1553   AST 23 05/31/2019 1455   AST 23 01/19/2017 1553   ALT 13 05/31/2019 1455   ALT 16 01/19/2017 1553   ALKPHOS 73 05/31/2019 1455   ALKPHOS 82 01/19/2017 1553   BILITOT 0.8 05/31/2019 1455   BILITOT 0.53 01/19/2017 1553   GFRNONAA >60 05/31/2019 1455   GFRAA >60 05/31/2019 1455    I No results found for: SPEP  Lab Results  Component Value Date   WBC 6.9 05/31/2019   NEUTROABS 3.3 05/31/2019   HGB 12.4 05/31/2019   HCT 39.0 05/31/2019   MCV 90.7 05/31/2019   PLT 265 05/31/2019      Chemistry      Component Value Date/Time   NA 140 05/31/2019 1455   NA 140 01/19/2017 1553   K 4.0 05/31/2019 1455   K 3.8 01/19/2017 1553   CL 107 05/31/2019 1455   CO2 24 05/31/2019 1455   CO2 27 01/19/2017 1553   BUN 14 05/31/2019 1455   BUN 11.3 01/19/2017 1553   CREATININE 0.91 05/31/2019 1455   CREATININE 0.8 01/19/2017 1553      Component Value Date/Time   CALCIUM 8.9 05/31/2019 1455   CALCIUM 10.3 01/19/2017 1553   ALKPHOS 73 05/31/2019 1455   ALKPHOS 82 01/19/2017 1553   AST 23 05/31/2019 1455   AST 23 01/19/2017 1553   ALT 13 05/31/2019 1455   ALT 16 01/19/2017 1553   BILITOT 0.8 05/31/2019 1455  BILITOT 0.53 01/19/2017 1553       No results found for: LABCA2  No components found for: [id number]  No results for  input(s): INR in the last 168 hours.  Urinalysis    Component Value Date/Time   COLORURINE YELLOW 07/16/2016 1650    STUDIES: Repeat mammography due at [facility] June 2019  ASSESSMENT: 69 y.o.  woman  (1) status post right upper inner quadrant lumpectomy 1994 for "breast cancer"; no data available; patient did not receive radiation, chemotherapy, or antiestrogen treatment  (2) status post left breast biopsy 11/06/2013 for a clinical T1c N0, stage IA invasive ductal carcinoma, grade 2, estrogen and progesterone receptor positive, HER-2 not amplified, with an MIB-1 of 10%  (3) genetics testing We recommended you pursue genetic testing of the genes on the CancerNext gene panel. Your test, which included sequencing and deletion/duplication analysis, was performed at [facility]. Testing did not reveal any clearly harmful mutations in any of these genes. The genes on the panel were APC, ATM, BARD1, BRCA1, BRCA2, BRIP1, BMPR1A, CDH1, CDK4, CDKN2A, CHEK2, EPCAM, GREM1, MLH1, MRE11A, MSH2, MSH6, MUTYH, NBN, NF1, PALB2, PMS2, POLD  (4) status post left lumpectomy and sentinel lymph node biopsy 12/11/2013 for a pT1c pN0, stage IA invasive ductal carcinoma, grade 3, repeat HER-2 testing again negative  (5) Oncotype DX score of 16 predicts a 10 year risk of outside the breast recurrence of 10% if the patient's only systemic treatment is tamoxifen for 5 years. Is also predicts no benefit from chemotherapy.  (6) radiation therapy completed 02/28/2014  (7) started anastrozole July 2015--  discontinued September 2015 secondary to bone aches  (8) cigarette abuse--stopped smoking August 2016  (9) tamoxifen prescribed 02/19/2015 -- patient never started it   (10) osteoporosis on bone density scan October 2017: on alendronate  PLAN: Jessica [name] is here today for follow up of her h/o breast cancer.  She has no signs of breast cancer recurrence today.  She is doing well.  Her mammogram is due and she  has this scheduled.    I recommended she stay up to date with her PCP visits.  She will need cancer screening for colon cancer, and skin cancer.  We discussed healthy diet, exercise, bone health, safe sun practices.    I let Jessica [name] know that we are happy to see her back here every year for surveillance, however since she is more than 5 years out from her diagnosis, she doesn't have to come back and see [place].  She would like to continue her f/u and I will see her back next year.  She knows that if she develops any issues, to please call our office, because we can always see her sooner.       Total encounter time: 20 minutes*   Wilber Bihari, NP 06/04/20 2:56 PM Medical Oncology and Hematology [facility] [place], [place] [address] Tel. [phone]    Fax. [phone]  *Total Encounter Time as defined by the Centers for Medicare and Medicaid Services includes, in addition to the face-to-face time of a patient visit (documented in the note above) non-face-to-face time: obtaining and reviewing outside history, ordering and reviewing medications, tests or procedures, care coordination (communications with other health care professionals or caregivers) and documentation in the medical record.

## 2020-06-06 ENCOUNTER — Telehealth: Payer: Self-pay | Admitting: Adult Health

## 2020-06-06 NOTE — Telephone Encounter (Signed)
Scheduled appts per 7/20 los. Pt confirmed appt date and time.

## 2020-07-19 IMAGING — US US THYROID
1 series · 12 of 25 positions shown · non-contrast
Comparison: 07/03/2016

CLINICAL DATA: Goiter.

EXAM:
THYROID ULTRASOUND
TECHNIQUE: Ultrasound examination of the thyroid gland and adjacent soft
tissues was performed.

[Series 1: us thyroid · 0.04mm/px · 12 of 53 slices shown]
[im 3/53]
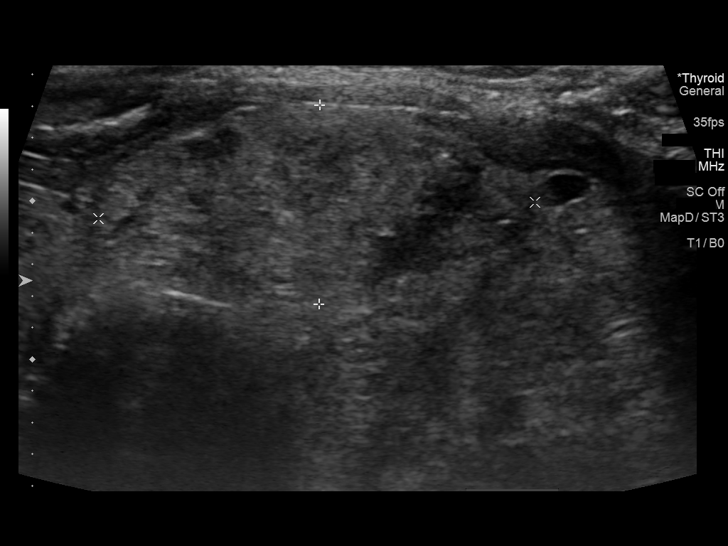
[im 7/53]
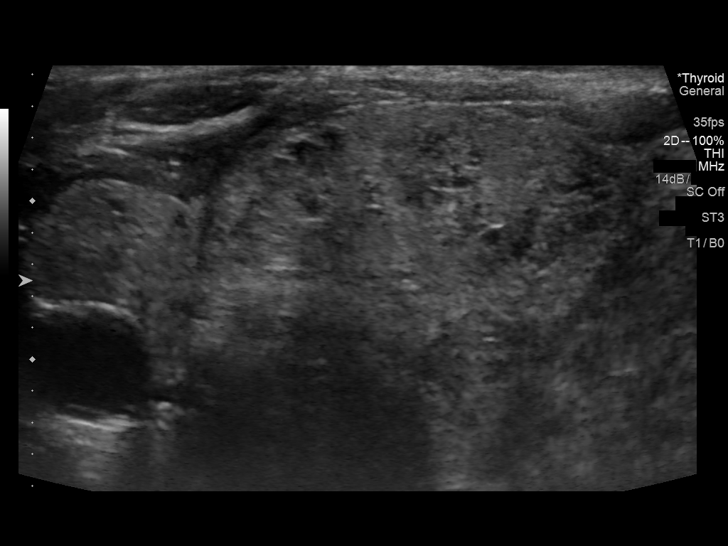
[im 11/53]
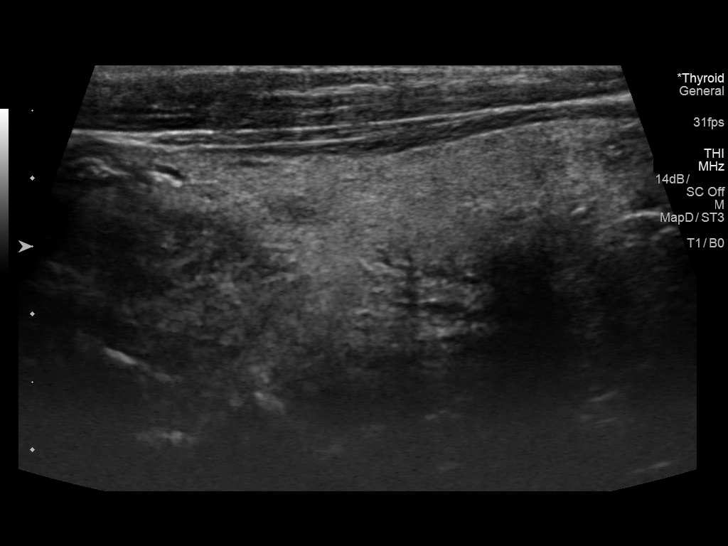
[im 16/53]
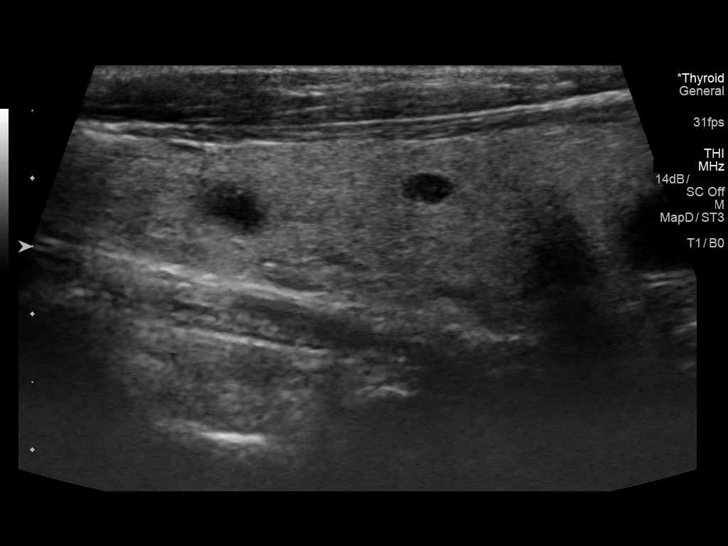
[im 20/53]
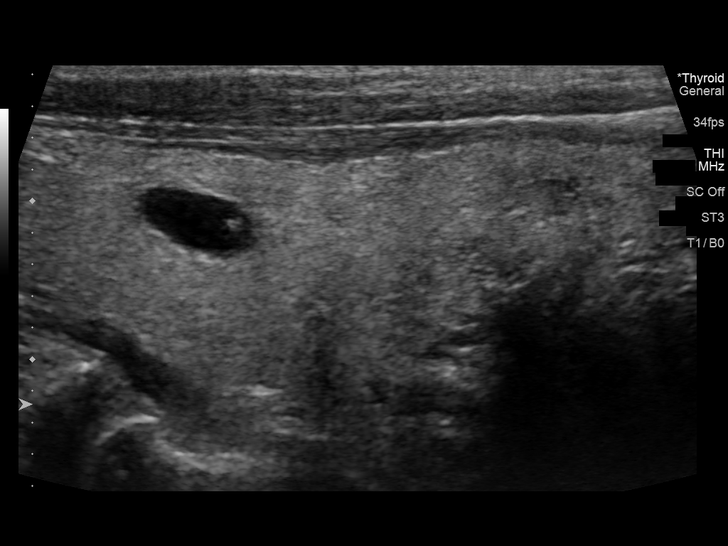
[im 24/53]
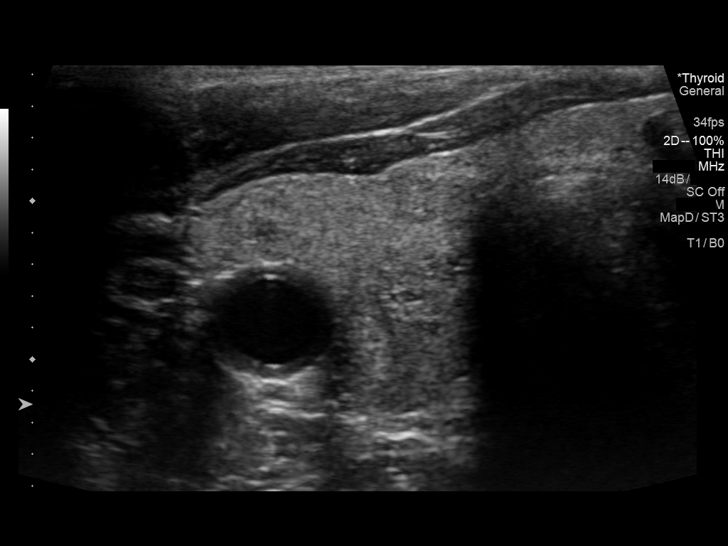
[im 29/53]
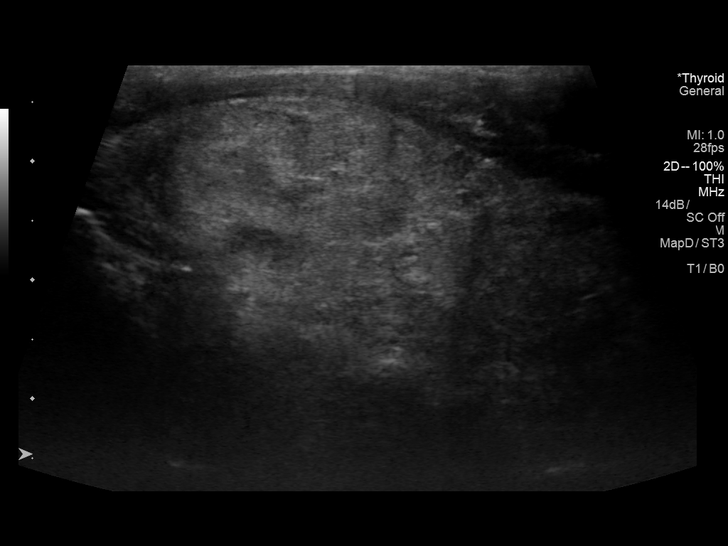
[im 33/53]
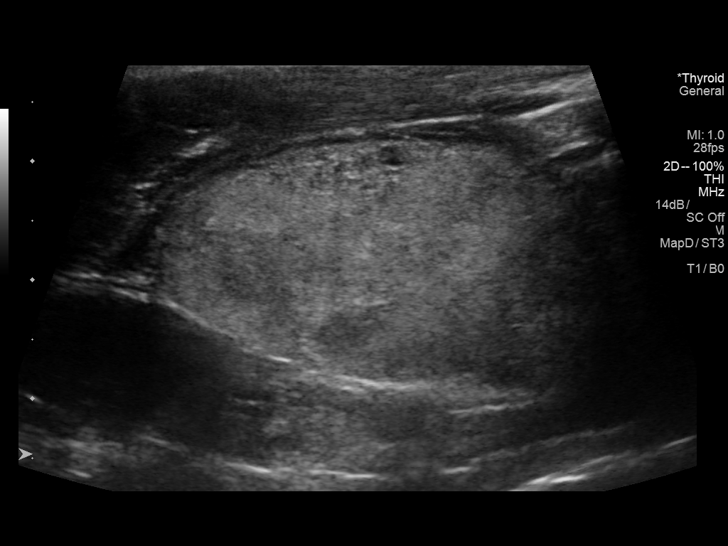
[im 37/53]
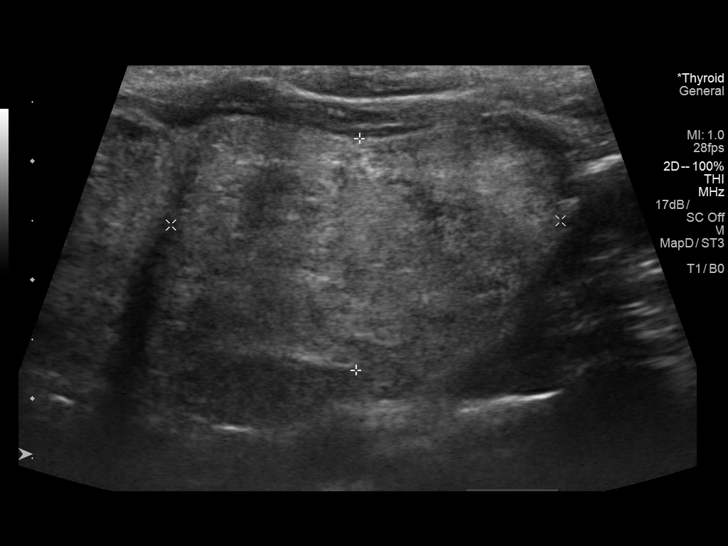
[im 42/53]
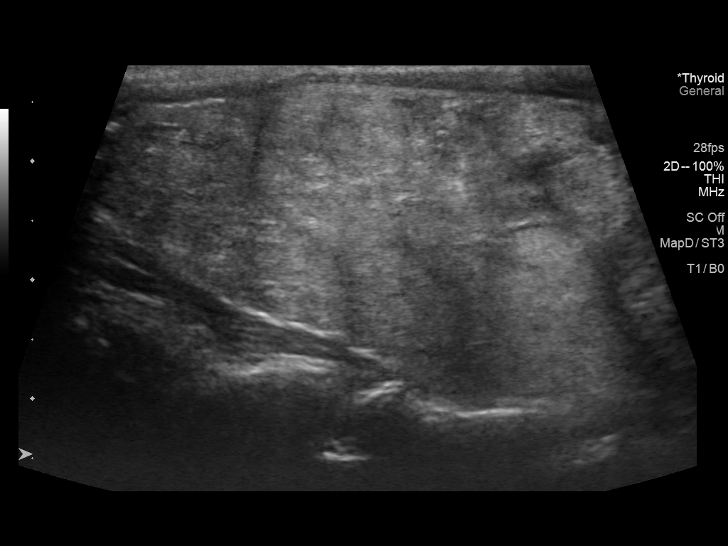
[im 46/53]
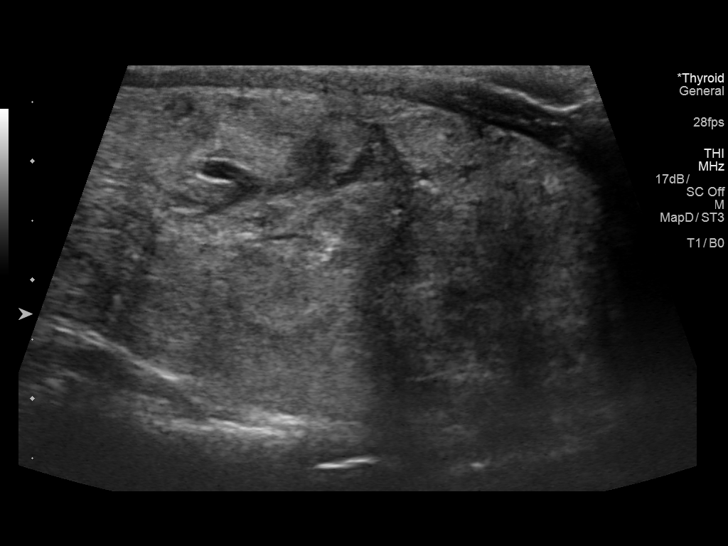
[im 50/53]
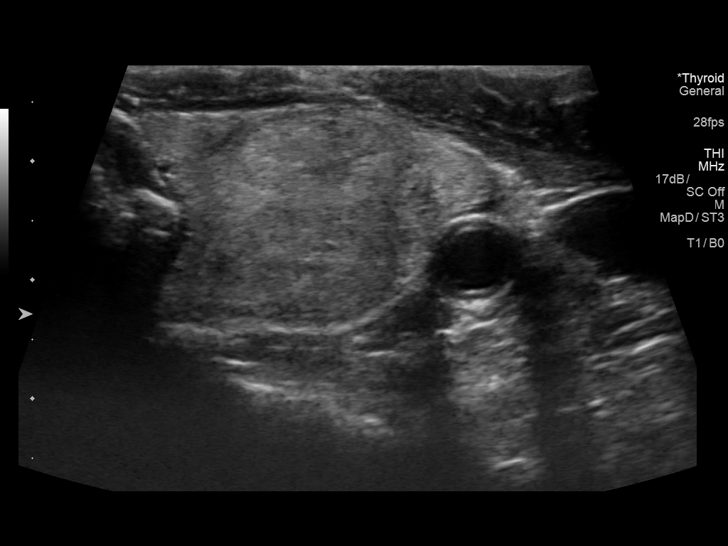

[12 of 25 positions shown; findings below may reference images not displayed]

FINDINGS: Parenchymal Echotexture: Moderately heterogenous

Isthmus: 0.3 cm, previously 0.3 cm

Right lobe: 5.1 x 1.7 x 1.8 cm, previously 5.1 x 1.8 x 1.9 cm

Left lobe: 6.7 x 3.1 x 4.5 cm, previously 6.5 x 3.2 x 4.4 cm

_________________________________________________________

Estimated total number of nodules >/= 1 cm: 5

Number of spongiform nodules >/=  2 cm not described below (TR1): 0

Number of mixed cystic and solid nodules >/= 1.5 cm not described
below (TR2): 0

_________________________________________________________

Nodule # 3:

Prior biopsy: No

Location: Right; Mid

Maximum size: 1.3 cm; Other 2 dimensions: 0.9 x 0.7 cm, previously,
1.3 x 0.9 x 1.2 cm

Composition: solid/almost completely solid (2)

Echogenicity: isoechoic (1)

Shape: not taller-than-wide (0)

Margins: ill-defined (0)

Echogenic foci: none (0)

ACR TI-RADS total points: 3.

ACR TI-RADS risk category:  TR3 (3 points).

Significant change in size (>/= 20% in two dimensions and minimal
increase of 2 mm): No

Change in features: No

Change in ACR TI-RADS risk category: No

ACR TI-RADS recommendations:

Given size (<1.4 cm) and appearance, this nodule does NOT meet
TI-RADS criteria for biopsy or dedicated follow-up.

_________________________________________________________

Nodule # 4:

Prior biopsy: No

Location: Left; Mid

Maximum size: 3.5 cm; Other 2 dimensions: 2.9 x 2.4 cm, previously,
3.1 x 2.9 x 2.3 cm

Composition: solid/almost completely solid (2)

Echogenicity: isoechoic (1)

Shape: not taller-than-wide (0)

Margins: smooth (0)

Echogenic foci: none (0)

ACR TI-RADS total points: 3.

ACR TI-RADS risk category:  TR3 (3 points).

Significant change in size (>/= 20% in two dimensions and minimal
increase of 2 mm): No

Change in features: No

Change in ACR TI-RADS risk category: No

ACR TI-RADS recommendations:

**Given size (>/= 2.5 cm) and appearance, fine needle aspiration of
this mildly suspicious nodule should be considered based on TI-RADS
criteria.

_________________________________________________________

Nodule # 5:

Prior biopsy: No

Location: Left; Mid

Maximum size: 3.6 cm; Other 2 dimensions: 2.4 x 2.0 cm, previously,
3.2 x 2.4 x 2.0 cm

Composition: solid/almost completely solid (2)

Echogenicity: isoechoic (1)

Shape: not taller-than-wide (0)

Margins: smooth (0)

Echogenic foci: none (0)

ACR TI-RADS total points: 3.

ACR TI-RADS risk category:  TR3 (3 points).

Significant change in size (>/= 20% in two dimensions and minimal
increase of 2 mm): No

Change in features: No

Change in ACR TI-RADS risk category: No

ACR TI-RADS recommendations:

**Given size (>/= 2.5 cm) and appearance, fine needle aspiration of
this mildly suspicious nodule should be considered based on TI-RADS
criteria.

_________________________________________________________

Nodule 1 in the isthmus measures 3.3 x 2.8 x 1.3 cm and previously
measured 3.8 x 2.8 x 1.4 cm. Biopsy was performed in 8888.

Nodule 6 in the left lower pole measures 3.3 x 2.5 x 2.0 cm and
previously measured 2.9 x 2.7 x 2.3 cm.
IMPRESSION: Nodules 1 and 6 are stable and previously underwent biopsy.

Nodules 4 and 5 are stable as described above.

All other nodules do not meet criteria for biopsy nor follow-up.

The above is in keeping with the ACR TI-RADS recommendations - [HOSPITAL] 2759;[DATE].

## 2021-06-08 NOTE — Progress Notes (Signed)
[place]  [phone] [phone]     ID: [name] OB: [birth year]  MR#: [id number]  KXF#:[id number]  Patient Care Team: Marda Stalker, PA-C as PCP - General (Family Medicine) [name], [name], NP as Nurse Practitioner (Hematology and Oncology) OTHER MD: [name], MD Gertie Fey)  CHIEF COMPLAINT: estrogen receptor positive breast cancer  CURRENT TREATMENT: Observation   INTERVAL HISTORY: Jessica [name] returns today for follow-up of her estrogen receptor positive breast cancer. She continues under observation.   She sees her PCP Marda Stalker regularly.  Her last appt with her was a week ago.  She is following her for her weight loss.  She had blood work on Friday that noted her thyroid levels were low (hyperthyroidism).  She says that she feels jittery as well.  Jessica [name] is being referred to endocrinology.     REVIEW OF SYSTEMS: Review of Systems  Constitutional:  Negative for appetite change, chills, fatigue, fever and unexpected weight change.  HENT:   Negative for hearing loss, lump/mass and trouble swallowing.   Eyes:  Negative for eye problems and icterus.  Respiratory:  Negative for chest tightness, cough and shortness of breath.   Cardiovascular:  Negative for chest pain, leg swelling and palpitations.  Gastrointestinal:  Negative for abdominal distention, abdominal pain, constipation, diarrhea, nausea and vomiting.  Endocrine: Negative for hot flashes.  Genitourinary:  Negative for difficulty urinating.   Musculoskeletal:  Negative for arthralgias.  Skin:  Negative for itching and rash.  Neurological:  Negative for dizziness, extremity weakness, headaches and numbness.  Hematological:  Negative for adenopathy. Does not bruise/bleed easily.  Psychiatric/Behavioral:  Negative for depression. The patient is not nervous/anxious.     She remains active, and is up to date with seeing her PCP and with her cancer screenings.       BREAST CANCER HISTORY: From the original intake note:  "Jessica [name]" has noted a mass in her left breast for at least several months. She didn't think it was worth bringing to medical attention. When she went for routine gynecologic followup under [name] NP, this was palpated in the patient was set up for bilateral diagnostic mammography 10/23/2013 at [facility]. She was found to have breast density category C. A new irregular mass was noted in the left axillary tail. Ultrasound was obtained the same day and showed a 1.4 cm lobulated mass in the left breast at the 2:00 position. Biopsy of this mass 11/06/2013 showed ([id number]) an invasive ductal carcinoma, grade 2 or 3, estrogen receptor 80% positive, with moderate staining intensity; progesterone receptor 90% positive with strong staining intensity; with an MIB-1 of 10% and no HER-2 amplification (these signals ratio was 1.54 in the copy number per cell was 2.00).  On 11/13/2013 the patient underwent bilateral breast MRI. This showed a 1.3 cm irregular enhancing mass in the upper outer quadrant of the left breast (axillary tail). There were no other masses of concern in either breast and no abnormal appearing lymph nodes.  The patient's subsequent history is as detailed below.   PAST MEDICAL HISTORY: Past Medical History:  Diagnosis Date   Anxiety    Breast cancer ([place]) 11/06/13   left   Cancer [facility])    BREAST CANCER 1990'S   Depression    Family history of colon cancer    Genetic testing    GERD (gastroesophageal reflux disease)    Hot flashes    Hx of radiation therapy 01/17/14- 02/28/14   left breast 5000 cGy in  25 sessions, left breast boost 1000 cGy in 5 sessions   Hyperlipidemia    Hypertension    DR. KINGSLEY   Osteoporosis 08/2016   T score -2.9 AP spine statistically improved from prior DEXA. Stable and other sites.   Smoker    Thyroid nodule    BENIGN-DR CORNELL   Vitamin D deficiency     PAST SURGICAL HISTORY: Past  Surgical History:  Procedure Laterality Date   ARM SURGERY     AT AGE 62-PLATE INSERTED-DUE TO FALL-lt   BIOPSY THYROID     BREAST LUMPECTOMY WITH NEEDLE LOCALIZATION AND AXILLARY SENTINEL LYMPH NODE BX Left 12/11/2013   Procedure: BREAST LUMPECTOMY WITH NEEDLE LOCALIZATION AND AXILLARY SENTINEL LYMPH NODE BX;  Surgeon: [name], MD;  Location: [facility];  Service: General;  Laterality: Left;   BREAST SURGERY  1994   rt lump   GYNECOLOGIC CRYOSURGERY  1980   CYTOTHERAPY OF CERVIX   TUBAL LIGATION      FAMILY HISTORY Family History  Problem Relation Age of Onset   Colon cancer Mother 54       deceased 62   Stomach cancer Sister        Dx and died in 52s; maternal half-sister   Heart disease Brother    Throat cancer Father        Dx and died in 52s; smoker   Colon cancer Sister 51       currently 73; maternal half-sister   the patient's father died in his mid 15s with cancer of the throat. The patient's mother died at the age of 50 with colon cancer. The patient had 4 brothers and 5 sisters. One sister was diagnosed with stomach cancer but the patient does not know at what age. One sister was diagnosed with colon cancer at age 34. There is no history of breast or ovarian cancer in the family to the patient's knowledge.   GYNECOLOGIC HISTORY:  Menarche age 72, first live birth age 65, the patient is Frostproof P4. She thinks she went through menopause approximately age 23. She did not take hormone replacement.   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 Her (second) husband [name] (goes by [name]) is retired from the [employer]. He works part-time as a Horticulturist, commercial. Daughter Jessica [name] lives in [place] where she works as a Product/process development scientist. Daughter Jessica [name] lives in [place] where she works as a Cabin crew. Daughter Jessica [name] works as a Marine scientist in [employer] here in [place]. Daughter Jessica [name] works  as a Emergency planning/management officer in [place]. The patient has 7 grandchildren. She is a Psychologist, forensic.     ADVANCED DIRECTIVES: In place; she has named her daughter [name] as healthcare power of attorney   HEALTH MAINTENANCE: Social History   Tobacco Use   Smoking status: Every Day    Packs/day: 0.50    Years: 47.00    Pack years: 23.50    Types: Cigarettes   Smokeless tobacco: Never   Tobacco comments:    01/29/14 decreased to 3 cigarettes daily  Vaping Use   Vaping Use: Never used  Substance Use Topics   Alcohol use: Yes    Alcohol/week: 1.0 standard drink    Types: 1 Glasses of wine per week   Drug use: No     Colonoscopy:2005  PAP:2014  Bone density:October 2017 showed osteoporosis  Lipid panel:  Allergies  Allergen Reactions   Aspirin Nausea And Vomiting  Effexor [Venlafaxine Hydrochloride] Other (See Comments)    Sees things    Ivp Dye [Iodinated Diagnostic Agents]     Happened 1970s per pt-sob   Penicillins     REACTION: hives/tongue swelled    Current Outpatient Medications  Medication Sig Dispense Refill   alendronate (FOSAMAX) 70 MG tablet TAKE 1 TABLET BY MOUTH EVERY WEEK WITH FULL GLASS OF WATER (Patient not taking: Reported on 06/04/2020) 12 tablet 0   amLODipine (NORVASC) 5 MG tablet Take 5 mg by mouth daily.       Cholecalciferol (VITAMIN D PO) Take 1 tablet by mouth daily.     emollient (BIAFINE) cream Apply topically as needed.      hydrochlorothiazide (HYDRODIURIL) 25 MG tablet Take 25 mg by mouth at bedtime.     Multiple Vitamins-Minerals (MULTIVITAMIN WITH MINERALS) tablet Take 1 tablet by mouth daily.     PARoxetine HCl (PAXIL PO) Take 10 mg by mouth See admin instructions. Pt takes only first 2 weeks of the month     No current facility-administered medications for this visit.    OBJECTIVE: Middle-aged [place] American woman who appears well  Vitals:   06/09/21 1413  BP: (!) 141/88  Pulse: 68  Resp: 18  Temp: 97.7 F (36.5 C)   SpO2: 100%      Body mass index is 17.13 kg/m.    ECOG FS:0 - Asymptomatic  GENERAL: Patient is a well appearing female in no acute distress HEENT:  Sclerae anicteric.  Mask in place. Neck is supple. + thyromegaly NODES:  No cervical, supraclavicular, or axillary lymphadenopathy palpated.  BREAST EXAM:  S/p bilateral lumpectomies, no sign of local recurrence LUNGS:  Clear to auscultation bilaterally.  No wheezes or rhonchi. HEART:  Regular rate and rhythm. No murmur appreciated. ABDOMEN:  Soft, nontender.  Positive, normoactive bowel sounds. No organomegaly palpated. MSK:  No focal spinal tenderness to palpation. Full range of motion bilaterally in the upper extremities. EXTREMITIES:  No peripheral edema.   SKIN:  Clear with no obvious rashes or skin changes. No nail dyscrasia. NEURO:  Nonfocal. Well oriented.  Appropriate affect.   LAB RESULTS:  CMP     Component Value Date/Time   NA 140 05/31/2019 1455   NA 140 01/19/2017 1553   K 4.0 05/31/2019 1455   K 3.8 01/19/2017 1553   CL 107 05/31/2019 1455   CO2 24 05/31/2019 1455   CO2 27 01/19/2017 1553   GLUCOSE 80 05/31/2019 1455   GLUCOSE 89 01/19/2017 1553   BUN 14 05/31/2019 1455   BUN 11.3 01/19/2017 1553   CREATININE 0.91 05/31/2019 1455   CREATININE 0.8 01/19/2017 1553   CALCIUM 8.9 05/31/2019 1455   CALCIUM 10.3 01/19/2017 1553   PROT 7.6 05/31/2019 1455   PROT 8.0 01/19/2017 1553   ALBUMIN 4.2 05/31/2019 1455   ALBUMIN 4.4 01/19/2017 1553   AST 23 05/31/2019 1455   AST 23 01/19/2017 1553   ALT 13 05/31/2019 1455   ALT 16 01/19/2017 1553   ALKPHOS 73 05/31/2019 1455   ALKPHOS 82 01/19/2017 1553   BILITOT 0.8 05/31/2019 1455   BILITOT 0.53 01/19/2017 1553   GFRNONAA >60 05/31/2019 1455   GFRAA >60 05/31/2019 1455    I No results found for: SPEP  Lab Results  Component Value Date   WBC 6.9 05/31/2019   NEUTROABS 3.3 05/31/2019   HGB 12.4 05/31/2019   HCT 39.0 05/31/2019   MCV 90.7 05/31/2019   PLT  265 05/31/2019  Chemistry      Component Value Date/Time   NA 140 05/31/2019 1455   NA 140 01/19/2017 1553   K 4.0 05/31/2019 1455   K 3.8 01/19/2017 1553   CL 107 05/31/2019 1455   CO2 24 05/31/2019 1455   CO2 27 01/19/2017 1553   BUN 14 05/31/2019 1455   BUN 11.3 01/19/2017 1553   CREATININE 0.91 05/31/2019 1455   CREATININE 0.8 01/19/2017 1553      Component Value Date/Time   CALCIUM 8.9 05/31/2019 1455   CALCIUM 10.3 01/19/2017 1553   ALKPHOS 73 05/31/2019 1455   ALKPHOS 82 01/19/2017 1553   AST 23 05/31/2019 1455   AST 23 01/19/2017 1553   ALT 13 05/31/2019 1455   ALT 16 01/19/2017 1553   BILITOT 0.8 05/31/2019 1455   BILITOT 0.53 01/19/2017 1553       No results found for: LABCA2  No components found for: LABCA125  No results for input(s): INR in the last 168 hours.  Urinalysis    Component Value Date/Time   COLORURINE YELLOW 07/16/2016 1650    STUDIES: Repeat mammography due at [facility] June 2019  ASSESSMENT: 70 y.o. [place] woman  (1) status post right upper inner quadrant lumpectomy 1994 for "breast cancer"; no data available; patient did not receive radiation, chemotherapy, or antiestrogen treatment  (2) status post left breast biopsy 11/06/2013 for a clinical T1c N0, stage IA invasive ductal carcinoma, grade 2, estrogen and progesterone receptor positive, HER-2 not amplified, with an MIB-1 of 10%  (3) genetics testing We recommended you pursue genetic testing of the genes on the CancerNext gene panel. Your test, which included sequencing and deletion/duplication analysis, was performed at [facility]. Testing did not reveal any clearly harmful mutations in any of these genes. The genes on the panel were APC, ATM, BARD1, BRCA1, BRCA2, BRIP1, BMPR1A, CDH1, CDK4, CDKN2A, CHEK2, EPCAM, GREM1, MLH1, MRE11A, MSH2, MSH6, MUTYH, NBN, NF1, PALB2, PMS2, POLD  (4) status post left lumpectomy and sentinel lymph node biopsy 12/11/2013 for a pT1c pN0, stage  IA invasive ductal carcinoma, grade 3, repeat HER-2 testing again negative  (5) Oncotype DX score of 16 predicts a 10 year risk of outside the breast recurrence of 10% if the patient's only systemic treatment is tamoxifen for 5 years. Is also predicts no benefit from chemotherapy.  (6) radiation therapy completed 02/28/2014  (7) started anastrozole July 2015--  discontinued September 2015 secondary to bone aches  (8) cigarette abuse--stopped smoking August 2016  (9) tamoxifen prescribed 02/19/2015 -- patient never started it   (10) osteoporosis on bone density scan October 2017: on alendronate  PLAN: Jessica [name] is here today for follow up of her h/o breast cancer.  She has no signs of breast cancer recurrence today.  She is doing well.  Her mammogram is overdue.  I placed an order for this and asked my nurse to fax it to [facility].  I reminded Jessica [name] to get this scheduled  Jessica [name] will continue to f/u with her PCP for her health maintenance.  I reviewed healthy diet and exercise.  She will return to see [place] in one year for continued surveillance and monitoring.  She knows to call for any questions that may arise between now and her next appointment.  We are happy to see her sooner if needed.   Total encounter time: 20 minutes* in chart review, radiology review in [facility], face to face visit time, order entry, care coordination, and documentation of the encounter.  Wilber Bihari, NP 06/09/21 2:29 PM Medical Oncology and Hematology [facility] [place], [place] [address] Tel. [phone]    Fax. [phone]  *Total Encounter Time as defined by the Centers for Medicare and Medicaid Services includes, in addition to the face-to-face time of a patient visit (documented in the note above) non-face-to-face time: obtaining and reviewing outside history, ordering and reviewing medications, tests or procedures, care coordination (communications with other health care  professionals or caregivers) and documentation in the medical record.

## 2021-06-09 ENCOUNTER — Inpatient Hospital Stay: Payer: Federal, State, Local not specified - PPO | Attending: Adult Health | Admitting: Adult Health

## 2021-06-09 ENCOUNTER — Other Ambulatory Visit: Payer: Self-pay

## 2021-06-09 ENCOUNTER — Encounter: Payer: Self-pay | Admitting: Adult Health

## 2021-06-09 VITALS — BP 141/88 | HR 68 | Temp 97.7°F | Resp 18 | Ht 63.0 in | Wt 96.7 lb

## 2021-06-09 DIAGNOSIS — C50212 Malignant neoplasm of upper-inner quadrant of left female breast: Secondary | ICD-10-CM | POA: Diagnosis not present

## 2021-06-09 DIAGNOSIS — M810 Age-related osteoporosis without current pathological fracture: Secondary | ICD-10-CM | POA: Diagnosis not present

## 2021-06-09 DIAGNOSIS — Z923 Personal history of irradiation: Secondary | ICD-10-CM | POA: Insufficient documentation

## 2021-06-09 DIAGNOSIS — Z170 Estrogen receptor positive status [ER+]: Secondary | ICD-10-CM | POA: Diagnosis not present

## 2021-06-09 DIAGNOSIS — Z79811 Long term (current) use of aromatase inhibitors: Secondary | ICD-10-CM | POA: Diagnosis not present

## 2021-06-09 DIAGNOSIS — Z87891 Personal history of nicotine dependence: Secondary | ICD-10-CM | POA: Insufficient documentation

## 2021-06-09 DIAGNOSIS — C50211 Malignant neoplasm of upper-inner quadrant of right female breast: Secondary | ICD-10-CM | POA: Insufficient documentation

## 2021-08-21 ENCOUNTER — Ambulatory Visit (INDEPENDENT_AMBULATORY_CARE_PROVIDER_SITE_OTHER): Payer: Federal, State, Local not specified - PPO | Admitting: Endocrinology

## 2021-08-21 ENCOUNTER — Encounter: Payer: Self-pay | Admitting: Endocrinology

## 2021-08-21 ENCOUNTER — Other Ambulatory Visit: Payer: Self-pay

## 2021-08-21 DIAGNOSIS — E0590 Thyrotoxicosis, unspecified without thyrotoxic crisis or storm: Secondary | ICD-10-CM | POA: Diagnosis not present

## 2021-08-21 NOTE — Patient Instructions (Signed)
No medication is needed for the thyroid. most of the time, a "lumpy thyroid" will eventually become overactive again.  this is usually a slow process, happening over the span of many years. Please come back for a follow-up appointment in 6 months.

## 2021-08-21 NOTE — Progress Notes (Signed)
Subjective:    Patient ID: Jessica [name], female    DOB: 1950-11-26, 70 y.o.   MRN: [id number]  HPI Pt is referred by Marda Stalker, PA, for hyperthyroidism.  Pt reports he was dx'ed with MNG in 2007.  she has never been on thyroid medication.  she has never had XRT to the anterior neck, or thyroid surgery.  she does not consume kelp or any other non-prescribed thyroid medication.  she has never been on amiodarone.  She reports hair loss and weight loss.  Pt says the mass of the goiter does not bother her.   Past Medical History:  Diagnosis Date   Anxiety    Breast cancer ([place]) 11/06/13   left   Cancer [facility])    BREAST CANCER 1990'S   Depression    Family history of colon cancer    Genetic testing    GERD (gastroesophageal reflux disease)    Hot flashes    Hx of radiation therapy 01/17/14- 02/28/14   left breast 5000 cGy in 25 sessions, left breast boost 1000 cGy in 5 sessions   Hyperlipidemia    Hypertension    DR. KINGSLEY   Osteoporosis 08/2016   T score -2.9 AP spine statistically improved from prior DEXA. Stable and other sites.   Smoker    Thyroid nodule    BENIGN-DR CORNELL   Vitamin D deficiency     Past Surgical History:  Procedure Laterality Date   ARM SURGERY     AT AGE 45-PLATE INSERTED-DUE TO FALL-lt   BIOPSY THYROID     BREAST LUMPECTOMY WITH NEEDLE LOCALIZATION AND AXILLARY SENTINEL LYMPH NODE BX Left 12/11/2013   Procedure: BREAST LUMPECTOMY WITH NEEDLE LOCALIZATION AND AXILLARY SENTINEL LYMPH NODE BX;  Surgeon: [name], MD;  Location: [facility];  Service: General;  Laterality: Left;   BREAST SURGERY  1994   rt lump   GYNECOLOGIC CRYOSURGERY  1980   CYTOTHERAPY OF CERVIX   TUBAL LIGATION      Social History   Socioeconomic History   Marital status: Married    Spouse name: Not on file   Number of children: Not on file   Years of education: Not on file   Highest education level: Not on file  Occupational History   Not  on file  Tobacco Use   Smoking status: Every Day    Packs/day: 0.50    Years: 47.00    Pack years: 23.50    Types: Cigarettes   Smokeless tobacco: Never   Tobacco comments:    01/29/14 decreased to 3 cigarettes daily  Vaping Use   Vaping Use: Never used  Substance and Sexual Activity   Alcohol use: Yes    Alcohol/week: 1.0 standard drink    Types: 1 Glasses of wine per week   Drug use: No   Sexual activity: Not Currently    Comment:  Menarche age 35, first live birth 63, P 75, menopause age 29, no HRT,intercourse age 48,  66 sexual partmers,,des neg  Other Topics Concern   Not on file  Social History Narrative   Not on file   Social Determinants of Health   Financial Resource Strain: Not on file  Food Insecurity: Not on file  Transportation Needs: Not on file  Physical Activity: Not on file  Stress: Not on file  Social Connections: Not on file  Intimate Partner Violence: Not on file    Current Outpatient Medications on File Prior to Visit  Medication Sig Dispense  Refill   Cholecalciferol (VITAMIN D PO) Take 1 tablet by mouth daily.     Multiple Vitamins-Minerals (MULTIVITAMIN WITH MINERALS) tablet Take 1 tablet by mouth daily.     PARoxetine HCl (PAXIL PO) Take 10 mg by mouth See admin instructions. Pt takes only first 2 weeks of the month     alendronate (FOSAMAX) 70 MG tablet TAKE 1 TABLET BY MOUTH EVERY WEEK WITH FULL GLASS OF WATER (Patient not taking: Reported on 06/04/2020) 12 tablet 0   amLODipine (NORVASC) 5 MG tablet Take 5 mg by mouth daily.       emollient (BIAFINE) cream Apply topically as needed.      hydrochlorothiazide (HYDRODIURIL) 25 MG tablet Take 25 mg by mouth at bedtime.     No current facility-administered medications on file prior to visit.    Allergies  Allergen Reactions   Aspirin Nausea And Vomiting   Effexor [Venlafaxine Hydrochloride] Other (See Comments)    Sees things    Ivp Dye [Iodinated Diagnostic Agents]     Happened 1970s per pt-sob    Penicillins     REACTION: hives/tongue swelled    Family History  Problem Relation Age of Onset   Colon cancer Mother 58       deceased 23   Throat cancer Father        Dx and died in 52s; smoker   Stomach cancer Sister        Dx and died in 60s; maternal half-sister   Colon cancer Sister 45       currently 15; maternal half-sister   Heart disease Brother    Goiter Other     BP 124/60 (BP Location: Right Arm, Patient Position: Sitting, Cuff Size: Normal)   Pulse 66   Ht 5\' 3"  (1.6 m)   Wt 101 lb 3.2 oz (45.9 kg)   SpO2 99%   BMI 17.93 kg/m    Review of Systems denies palpitations, sob, dysphagia, excessive diaphoresis, tremor, anxiety, and heat intolerance.      Objective:   Physical Exam VS: see vs page GEN: no distress HEAD: head: no deformity eyes: no periorbital swelling, no proptosis external nose and ears are normal NECK: supple, thyroid is 5-10 times normal size, with irreg surface.   CHEST WALL: no deformity LUNGS: clear to auscultation CV: reg rate and rhythm; slight syst murmur.  MUSCULOSKELETAL: gait is normal and steady EXTEMITIES: no deformity.  no leg edema NEURO:  readily moves all 4's.  sensation is intact to touch on all 4's.  No tremor SKIN:  Normal texture and temperature.  No rash or suspicious lesion is visible.  Not diaphoretic NODES:  None palpable at the neck PSYCH: alert, well-oriented.  Does not appear anxious nor depressed.   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  The other was nondiagnostic.    [place] (2019): Nodules 1 and 6 are stable and previously underwent biopsy.  Nodules 4 and 5 are stable as described above.  All other nodules do not meet criteria for biopsy nor follow-up.  I have reviewed outside records, and summarized:   Pt was noted to have MNG, and referred  here.  HTN, dyslipidemia, and anxiety were also addressed.      Assessment & Plan:  MNG, new to me.  Euthyroid, but she is at risk for  recurrent abnormal thyroid function.   Patient Instructions  No medication is needed for the thyroid. most of the time, a "lumpy thyroid" will eventually become overactive again.  this is usually a slow process, happening over the span of many years. Please come back for a follow-up appointment in 6 months.

## 2021-08-23 DIAGNOSIS — E0590 Thyrotoxicosis, unspecified without thyrotoxic crisis or storm: Secondary | ICD-10-CM | POA: Insufficient documentation

## 2022-02-19 ENCOUNTER — Ambulatory Visit: Payer: Federal, State, Local not specified - PPO | Admitting: Endocrinology

## 2022-09-29 ENCOUNTER — Encounter: Payer: Self-pay | Admitting: Adult Health

## 2023-08-25 ENCOUNTER — Encounter: Payer: Self-pay | Admitting: Family Medicine

## 2023-08-26 ENCOUNTER — Other Ambulatory Visit: Payer: Self-pay | Admitting: Family Medicine

## 2023-08-26 DIAGNOSIS — R229 Localized swelling, mass and lump, unspecified: Secondary | ICD-10-CM

## 2023-09-21 ENCOUNTER — Other Ambulatory Visit: Payer: Federal, State, Local not specified - PPO
